# Patient Record
Sex: Male | Born: 1965 | Race: White | Hispanic: No | Marital: Married | Smoking: Never smoker
Health system: Southern US, Community
[De-identification: ages and names within clinical notes are randomized; demographics above are authoritative.]

## PROBLEM LIST (undated history)

## (undated) DIAGNOSIS — G44009 Cluster headache syndrome, unspecified, not intractable: Secondary | ICD-10-CM

## (undated) DIAGNOSIS — T7840XA Allergy, unspecified, initial encounter: Secondary | ICD-10-CM

## (undated) DIAGNOSIS — J309 Allergic rhinitis, unspecified: Secondary | ICD-10-CM

## (undated) DIAGNOSIS — M545 Low back pain: Secondary | ICD-10-CM

## (undated) DIAGNOSIS — G2581 Restless legs syndrome: Secondary | ICD-10-CM

## (undated) HISTORY — DX: Cluster headache syndrome, unspecified, not intractable: G44.009

## (undated) HISTORY — DX: Low back pain: M54.5

## (undated) HISTORY — PX: KNEE SURGERY: SHX244

## (undated) HISTORY — DX: Allergy, unspecified, initial encounter: T78.40XA

## (undated) HISTORY — DX: Allergic rhinitis, unspecified: J30.9

## (undated) HISTORY — PX: APPENDECTOMY: SHX54

## (undated) HISTORY — DX: Restless legs syndrome: G25.81

## (undated) HISTORY — PX: TONSILLECTOMY: SUR1361

---

## 2005-10-16 ENCOUNTER — Ambulatory Visit: Payer: Self-pay | Admitting: Family Medicine

## 2005-10-28 ENCOUNTER — Ambulatory Visit: Payer: Self-pay | Admitting: Family Medicine

## 2007-08-02 ENCOUNTER — Ambulatory Visit: Payer: Self-pay | Admitting: Family Medicine

## 2007-08-02 DIAGNOSIS — J309 Allergic rhinitis, unspecified: Secondary | ICD-10-CM

## 2007-08-02 DIAGNOSIS — M545 Low back pain, unspecified: Secondary | ICD-10-CM

## 2007-08-02 HISTORY — DX: Low back pain, unspecified: M54.50

## 2007-08-02 HISTORY — DX: Allergic rhinitis, unspecified: J30.9

## 2007-08-02 LAB — CONVERTED CEMR LAB
Albumin: 4 g/dL (ref 3.5–5.2)
Alkaline Phosphatase: 74 units/L (ref 39–117)
BUN: 19 mg/dL (ref 6–23)
Basophils Absolute: 0 10*3/uL (ref 0.0–0.1)
Blood in Urine, dipstick: NEGATIVE
Cholesterol: 153 mg/dL (ref 0–200)
Eosinophils Absolute: 0.4 10*3/uL (ref 0.0–0.6)
GFR calc Af Amer: 105 mL/min
GFR calc non Af Amer: 87 mL/min
HDL: 25.4 mg/dL — ABNORMAL LOW (ref >39.0)
Hemoglobin: 15.1 g/dL (ref 13.0–17.0)
LDL Cholesterol: 112 mg/dL — ABNORMAL HIGH (ref 0–99)
Lymphocytes Relative: 28.4 % (ref 12.0–46.0)
MCHC: 35.2 g/dL (ref 30.0–36.0)
Monocytes Absolute: 0.6 10*3/uL (ref 0.2–0.7)
Monocytes Relative: 11.3 % — ABNORMAL HIGH (ref 3.0–11.0)
Neutro Abs: 3 10*3/uL (ref 1.4–7.7)
Platelets: 277 10*3/uL (ref 150–400)
Potassium: 4.6 meq/L (ref 3.5–5.1)
Specific Gravity, Urine: >=1.03
Total Protein: 6.5 g/dL (ref 6.0–8.3)
Triglycerides: 78 mg/dL (ref 0–149)
Urobilinogen, UA: 0.2

## 2007-08-09 ENCOUNTER — Ambulatory Visit: Payer: Self-pay | Admitting: Family Medicine

## 2008-03-21 ENCOUNTER — Encounter: Admission: RE | Admit: 2008-03-21 | Discharge: 2008-03-21 | Payer: Self-pay | Admitting: Orthopedic Surgery

## 2008-03-28 ENCOUNTER — Ambulatory Visit: Payer: Self-pay | Admitting: Family Medicine

## 2008-03-28 DIAGNOSIS — G2581 Restless legs syndrome: Secondary | ICD-10-CM

## 2008-03-28 HISTORY — DX: Restless legs syndrome: G25.81

## 2008-10-26 ENCOUNTER — Ambulatory Visit: Payer: Self-pay | Admitting: Family Medicine

## 2009-02-08 ENCOUNTER — Ambulatory Visit: Payer: Self-pay | Admitting: Family Medicine

## 2009-02-08 DIAGNOSIS — G44009 Cluster headache syndrome, unspecified, not intractable: Secondary | ICD-10-CM | POA: Insufficient documentation

## 2009-02-08 HISTORY — DX: Cluster headache syndrome, unspecified, not intractable: G44.009

## 2011-06-16 ENCOUNTER — Other Ambulatory Visit (INDEPENDENT_AMBULATORY_CARE_PROVIDER_SITE_OTHER): Payer: BC Managed Care – PPO

## 2011-06-16 DIAGNOSIS — Z0000 Encounter for general adult medical examination without abnormal findings: Secondary | ICD-10-CM

## 2011-06-16 LAB — CBC WITH DIFFERENTIAL/PLATELET
Basophils Absolute: 0 10*3/uL (ref 0.0–0.1)
HCT: 44.7 % (ref 39.0–52.0)
Hemoglobin: 15.3 g/dL (ref 13.0–17.0)
Lymphs Abs: 1.3 10*3/uL (ref 0.7–4.0)
Monocytes Relative: 8.8 % (ref 3.0–12.0)
Neutro Abs: 2.7 10*3/uL (ref 1.4–7.7)
RDW: 12.7 % (ref 11.5–14.6)

## 2011-06-16 LAB — BASIC METABOLIC PANEL
CO2: 29 mEq/L (ref 19–32)
Chloride: 104 mEq/L (ref 96–112)
GFR: 83.45 mL/min (ref >60.00)
Glucose, Bld: 101 mg/dL — ABNORMAL HIGH (ref 70–99)
Potassium: 4.6 mEq/L (ref 3.5–5.1)
Sodium: 140 mEq/L (ref 135–145)

## 2011-06-16 LAB — POCT URINALYSIS DIPSTICK
Bilirubin, UA: NEGATIVE
Glucose, UA: NEGATIVE
Ketones, UA: NEGATIVE
Leukocytes, UA: NEGATIVE

## 2011-06-16 LAB — LIPID PANEL
LDL Cholesterol: 101 mg/dL — ABNORMAL HIGH (ref 0–99)
VLDL: 11.2 mg/dL (ref 0.0–40.0)

## 2011-06-16 LAB — HEPATIC FUNCTION PANEL
ALT: 20 U/L (ref 0–53)
Bilirubin, Direct: 0 mg/dL (ref 0.0–0.3)
Total Bilirubin: 0.6 mg/dL (ref 0.3–1.2)

## 2011-06-20 ENCOUNTER — Encounter: Payer: Self-pay | Admitting: Family Medicine

## 2011-06-23 ENCOUNTER — Ambulatory Visit (INDEPENDENT_AMBULATORY_CARE_PROVIDER_SITE_OTHER): Payer: BC Managed Care – PPO | Admitting: Family Medicine

## 2011-06-23 ENCOUNTER — Encounter: Payer: Self-pay | Admitting: Family Medicine

## 2011-06-23 DIAGNOSIS — Z0000 Encounter for general adult medical examination without abnormal findings: Secondary | ICD-10-CM

## 2011-06-23 DIAGNOSIS — G4700 Insomnia, unspecified: Secondary | ICD-10-CM | POA: Insufficient documentation

## 2011-06-23 NOTE — Patient Instructions (Signed)
Discontinue all caffeinated beverages.  Call me in two weeks if the sleep dysfunction persists

## 2011-06-23 NOTE — Progress Notes (Signed)
  Subjective:    Patient ID: Austin Bonilla, male    DOB: 1966-04-03, 45 y.o.   MRN: [id number]  HPI  Austin Bonilla is a 45 year old, married male, nonsmoker, who comes in today for general physical examination  Is always been in excellent health.  He said no chronic health problems.  Three months ago he began having sleep dysfunction.  Humibid can go to sleep once he goes to sleep.  He stays asleep.  No history of depression, anxiety.  Psychiatric review of systems negative,  He's also noticed decreased urinary stream.  No other urologic conditions.  Family history negative.  He does drink 3 to plus cups of caffeinated beverages daily.    Review of Systems  Constitutional: Negative.   HENT: Negative.   Eyes: Negative.   Respiratory: Negative.   Cardiovascular: Negative.   Gastrointestinal: Negative.   Genitourinary: Negative.   Musculoskeletal: Negative.   Skin: Negative.   Neurological: Negative.   Hematological: Negative.   Psychiatric/Behavioral: Negative.        Objective:   Physical Exam  Constitutional: He is oriented to person, place, and time. He appears well-developed and well-nourished.  HENT:  Head: Normocephalic and atraumatic.  Right Ear: External ear normal.  Left Ear: External ear normal.  Nose: Nose normal.  Mouth/Throat: Oropharynx is clear and moist.  Eyes: Conjunctivae and EOM are normal. Pupils are equal, round, and reactive to light.  Neck: Normal range of motion. Neck supple. No JVD present. No tracheal deviation present. No thyromegaly present.  Cardiovascular: Normal rate, regular rhythm, normal heart sounds and intact distal pulses.  Exam reveals no gallop and no friction rub.   No murmur heard. Pulmonary/Chest: Effort normal and breath sounds normal. No stridor. No respiratory distress. He has no wheezes. He has no rales. He exhibits no tenderness.  Abdominal: Soft. Bowel sounds are normal. He exhibits no distension and no mass. There is no  tenderness. There is no rebound and no guarding.  Genitourinary: Rectum normal, prostate normal and penis normal. Guaiac negative stool. No penile tenderness.  Musculoskeletal: Normal range of motion. He exhibits no edema and no tenderness.  Lymphadenopathy:    He has no cervical adenopathy.  Neurological: He is alert and oriented to person, place, and time. He has normal reflexes. No cranial nerve deficit. He exhibits normal muscle tone.  Skin: Skin is warm and dry. No rash noted. No erythema. No pallor.       Total body skin exam negative except for scar right upper quadrant, where he had a mole removed many years ago  Psychiatric: He has a normal mood and affect. His behavior is normal. Judgment and thought content normal.          Assessment & Plan:  Healthy male.  Insomnia and decreased urinary stream.  Plan DC caffeine return in two weeks.  The symptoms persist

## 2011-10-06 ENCOUNTER — Telehealth: Payer: Self-pay | Admitting: Family Medicine

## 2011-10-06 NOTE — Telephone Encounter (Signed)
Pt would like to be worked in today he is experiencing a sore throat, chills, sneezing and coughing

## 2013-12-05 ENCOUNTER — Other Ambulatory Visit (INDEPENDENT_AMBULATORY_CARE_PROVIDER_SITE_OTHER): Payer: BC Managed Care – PPO

## 2013-12-05 DIAGNOSIS — Z0000 Encounter for general adult medical examination without abnormal findings: Secondary | ICD-10-CM

## 2013-12-05 LAB — BASIC METABOLIC PANEL
BUN: 15 mg/dL (ref 6–23)
CALCIUM: 9 mg/dL (ref 8.4–10.5)
CO2: 26 mEq/L (ref 19–32)
CREATININE: 1 mg/dL (ref 0.4–1.5)
Chloride: 108 mEq/L (ref 96–112)
GFR: 85.46 mL/min (ref >60.00)
GLUCOSE: 94 mg/dL (ref 70–99)
Potassium: 4.2 mEq/L (ref 3.5–5.1)
Sodium: 142 mEq/L (ref 135–145)

## 2013-12-05 LAB — HEPATIC FUNCTION PANEL
ALK PHOS: 66 U/L (ref 39–117)
ALT: 19 U/L (ref 0–53)
AST: 18 U/L (ref 0–37)
Albumin: 4.1 g/dL (ref 3.5–5.2)
BILIRUBIN TOTAL: 0.6 mg/dL (ref 0.3–1.2)
Bilirubin, Direct: 0 mg/dL (ref 0.0–0.3)
Total Protein: 7 g/dL (ref 6.0–8.3)

## 2013-12-05 LAB — CBC WITH DIFFERENTIAL/PLATELET
BASOS ABS: 0 10*3/uL (ref 0.0–0.1)
Basophils Relative: 0.2 % (ref 0.0–3.0)
EOS PCT: 6.3 % — AB (ref 0.0–5.0)
Eosinophils Absolute: 0.3 10*3/uL (ref 0.0–0.7)
HEMATOCRIT: 46.8 % (ref 39.0–52.0)
Hemoglobin: 15.4 g/dL (ref 13.0–17.0)
LYMPHS ABS: 1.3 10*3/uL (ref 0.7–4.0)
Lymphocytes Relative: 25 % (ref 12.0–46.0)
MCHC: 33 g/dL (ref 30.0–36.0)
MCV: 94.2 fl (ref 78.0–100.0)
MONOS PCT: 8.7 % (ref 3.0–12.0)
Monocytes Absolute: 0.5 10*3/uL (ref 0.1–1.0)
NEUTROS PCT: 59.8 % (ref 43.0–77.0)
Neutro Abs: 3.2 10*3/uL (ref 1.4–7.7)
PLATELETS: 241 10*3/uL (ref 150.0–400.0)
RBC: 4.97 Mil/uL (ref 4.22–5.81)
RDW: 12.4 % (ref 11.5–14.6)
WBC: 5.3 10*3/uL (ref 4.5–10.5)

## 2013-12-05 LAB — POCT URINALYSIS DIPSTICK
Bilirubin, UA: NEGATIVE
GLUCOSE UA: NEGATIVE
KETONES UA: NEGATIVE
Leukocytes, UA: NEGATIVE
Nitrite, UA: NEGATIVE
PROTEIN UA: NEGATIVE
RBC UA: NEGATIVE
SPEC GRAV UA: 1.02
Urobilinogen, UA: 0.2
pH, UA: 7.5

## 2013-12-05 LAB — TSH: TSH: 1.92 u[IU]/mL (ref 0.35–5.50)

## 2013-12-05 LAB — LIPID PANEL
CHOLESTEROL: 174 mg/dL (ref 0–200)
HDL: 35.5 mg/dL — ABNORMAL LOW (ref >39.00)
LDL Cholesterol: 124 mg/dL — ABNORMAL HIGH (ref 0–99)
TRIGLYCERIDES: 72 mg/dL (ref 0.0–149.0)
Total CHOL/HDL Ratio: 5
VLDL: 14.4 mg/dL (ref 0.0–40.0)

## 2013-12-12 ENCOUNTER — Ambulatory Visit (INDEPENDENT_AMBULATORY_CARE_PROVIDER_SITE_OTHER): Payer: BC Managed Care – PPO | Admitting: Family Medicine

## 2013-12-12 ENCOUNTER — Encounter: Payer: Self-pay | Admitting: Family Medicine

## 2013-12-12 VITALS — BP 130/90 | Temp 98.1°F | Ht 73.75 in | Wt 240.0 lb

## 2013-12-12 DIAGNOSIS — M79609 Pain in unspecified limb: Secondary | ICD-10-CM

## 2013-12-12 DIAGNOSIS — M79674 Pain in right toe(s): Secondary | ICD-10-CM

## 2013-12-12 DIAGNOSIS — Z0000 Encounter for general adult medical examination without abnormal findings: Secondary | ICD-10-CM

## 2013-12-12 LAB — URIC ACID: Uric Acid, Serum: 5.5 mg/dL (ref 4.0–7.8)

## 2013-12-12 NOTE — Patient Instructions (Signed)
Continue good health habits  Remember to walk 30 minutes daily  We will check a uric acid level to see if the swelling in your toe is related to gout  If it's not that I would recommend [facility]

## 2013-12-12 NOTE — Progress Notes (Signed)
   Subjective:    Patient ID: Austin Bonilla, male    DOB: 10/25/1966, 48 y.o.   MRN: [id number]  HPI Austin Bonilla is a 48 year old married male nonsmoker who comes in today for general physical examination  He's always been in excellent health he said no chronic health problems. 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   HENT: Negative.   Eyes: Negative.   Respiratory: Negative.   Cardiovascular: Negative.   Gastrointestinal: Negative.   Endocrine: Negative.   Genitourinary: Negative.   Musculoskeletal: Negative.   Skin: Negative.   Allergic/Immunologic: Negative.   Neurological: Negative.   Hematological: Negative.   Psychiatric/Behavioral: Negative.        Objective:   Physical Exam  Nursing note and vitals reviewed. Constitutional: He is oriented to person, place, and time. He appears well-developed and well-nourished.  HENT:  Head: Normocephalic and atraumatic.  Right Ear: External ear normal.  Left Ear: External ear normal.  Nose: Nose normal.  Mouth/Throat: Oropharynx is clear and moist.  Eyes: Conjunctivae and EOM are normal. Pupils are equal, round, and reactive to light.  Neck: Normal range of motion. Neck supple. No JVD present. No tracheal deviation present. No thyromegaly present.  Cardiovascular: Normal rate, regular rhythm, normal heart sounds and intact distal pulses.  Exam reveals no gallop and no friction rub.   No murmur heard. Pulmonary/Chest: Effort normal and breath sounds normal. No stridor. No respiratory distress. He has no wheezes. He has no rales. He exhibits no tenderness.  Abdominal: Soft. Bowel sounds are normal. He exhibits no distension and no mass. There is no tenderness. There is no rebound and no guarding.  Genitourinary: Rectum  normal, prostate normal and penis normal. Guaiac negative stool. No penile tenderness.  Musculoskeletal: Normal range of motion. He exhibits no edema and no tenderness.  Lymphadenopathy:    He has no cervical adenopathy.  Neurological: He is alert and oriented to person, place, and time. He has normal reflexes. No cranial nerve deficit. He exhibits normal muscle tone.  Skin: Skin is warm and dry. No rash noted. No erythema. No pallor.  Psychiatric: He has a normal mood and affect. His behavior is normal. Judgment and thought content normal.   He has deformity and swelling of his proximal joint right great toe. He says his been that way for years but is getting worse. He's never had acute pain redness or swelling       Assessment & Plan:  Healthy male Swelling right great toe//////// check uric acid level Swelling right great toe rule out gout check uric acid level

## 2013-12-12 NOTE — Progress Notes (Signed)
Pre visit review using our clinic review tool, if applicable. No additional management support is needed unless otherwise documented below in the visit note. 

## 2013-12-14 ENCOUNTER — Telehealth: Payer: Self-pay | Admitting: Family Medicine

## 2013-12-14 NOTE — Telephone Encounter (Signed)
Pt states he has been trying to contact you. pls call on new mobile number listed

## 2013-12-15 NOTE — Telephone Encounter (Signed)
Spoke with patient.

## 2014-10-06 ENCOUNTER — Encounter: Payer: Self-pay | Admitting: Family Medicine

## 2014-10-06 ENCOUNTER — Ambulatory Visit (INDEPENDENT_AMBULATORY_CARE_PROVIDER_SITE_OTHER): Payer: BC Managed Care – PPO | Admitting: Family Medicine

## 2014-10-06 VITALS — BP 140/90 | Temp 97.9°F | Wt 239.0 lb

## 2014-10-06 DIAGNOSIS — M545 Low back pain, unspecified: Secondary | ICD-10-CM

## 2014-10-06 MED ORDER — METHYLPREDNISOLONE 4 MG PO KIT: PACK | ORAL | Status: DC

## 2014-10-06 MED ORDER — CYCLOBENZAPRINE HCL 10 MG PO TABS: 10.0000 mg | ORAL_TABLET | Freq: Three times a day (TID) | ORAL | Status: DC | PRN

## 2014-10-06 MED ORDER — HYDROCODONE-ACETAMINOPHEN 10-325 MG PO TABS: 1.0000 | ORAL_TABLET | Freq: Four times a day (QID) | ORAL | Status: DC | PRN

## 2014-10-06 NOTE — Progress Notes (Signed)
   Subjective:    Patient ID: Austin Bonilla, male    DOB: 1966/06/14, 48 y.o.   MRN: [id number]  HPI Here for 2 weeks of sharp lower back pains. No recent trauma. He has tried 800 mg of Motrin with partial relief. No radiation to the legs.    Review of Systems  Constitutional: Negative.   Musculoskeletal: Positive for back pain.       Objective:   Physical Exam  Constitutional: He appears well-developed and well-nourished.  In pain  Musculoskeletal:  Tender in the lower back with some spasm. Full ROM           Assessment & Plan:  Treat with meds for pain, inflammation, and spasm. Recheck prn

## 2014-10-06 NOTE — Progress Notes (Signed)
Pre visit review using our clinic review tool, if applicable. No additional management support is needed unless otherwise documented below in the visit note. 

## 2014-12-27 ENCOUNTER — Telehealth: Payer: Self-pay | Admitting: Family Medicine

## 2014-12-27 NOTE — Telephone Encounter (Signed)
Patient's BP has been bp 140/100,137/100,122/89.  He is scheduled for Monday at 3:30.

## 2014-12-28 NOTE — Telephone Encounter (Signed)
noted 

## 2015-01-01 ENCOUNTER — Ambulatory Visit (INDEPENDENT_AMBULATORY_CARE_PROVIDER_SITE_OTHER): Payer: BLUE CROSS/BLUE SHIELD | Admitting: Family Medicine

## 2015-01-01 ENCOUNTER — Encounter: Payer: Self-pay | Admitting: Family Medicine

## 2015-01-01 VITALS — BP 120/90 | Temp 98.3°F | Wt 235.0 lb

## 2015-01-01 DIAGNOSIS — R030 Elevated blood-pressure reading, without diagnosis of hypertension: Secondary | ICD-10-CM

## 2015-01-01 NOTE — Patient Instructions (Addendum)
No salt diet  Walk 30 minutes daily  Check your blood pressure daily in the morning  Return mid-April for follow-up............Marland Kitchen. bring a record of all your blood pressure readings and the device

## 2015-01-01 NOTE — Progress Notes (Signed)
Pre visit review using our clinic review tool, if applicable. No additional management support is needed unless otherwise documented below in the visit note. 

## 2015-01-01 NOTE — Progress Notes (Signed)
   Subjective:    Patient ID: Mary SellaWilliam M Vilar, male    DOB: August 11, 1966, 49 y.o.   MRN: [id number]  HPI Chrissie NoaWilliam is a 49 year old married male nonsmoker who comes in today because his blood pressure was elevated at the allergy office  His blood pressure was 140/100 there. He's been monitoring his blood pressure at home. His systolics are normal in the 120 to 1:30 range diastolics are high in the 90s.  Father had hypertension   Review of Systems    review of systems otherwise negative Objective:   Physical Exam  Well-developed well-nourished male no acute distress vital signs stable he is afebrile BP right arm sitting position 130/90      Assessment & Plan:  Elevated blood pressure,,,,,,,,,, no salt diet,,,,, BP check daily,,, follow-up mid-April

## 2015-02-12 ENCOUNTER — Ambulatory Visit (INDEPENDENT_AMBULATORY_CARE_PROVIDER_SITE_OTHER): Payer: BLUE CROSS/BLUE SHIELD | Admitting: Family Medicine

## 2015-02-12 ENCOUNTER — Encounter: Payer: Self-pay | Admitting: Family Medicine

## 2015-02-12 VITALS — BP 120/80 | Temp 98.7°F | Wt 235.0 lb

## 2015-02-12 DIAGNOSIS — I10 Essential (primary) hypertension: Secondary | ICD-10-CM

## 2015-02-12 MED ORDER — HYDROCHLOROTHIAZIDE 12.5 MG PO TABS: 12.5000 mg | ORAL_TABLET | Freq: Every day | ORAL | Status: DC

## 2015-02-12 NOTE — Patient Instructions (Signed)
Hydrochlorothiazide 12.5 mg.....Marland Kitchen. one tablet daily in the morning  Omron pump up digital blood pressure cuff.......... check your blood pressure right arm sitting position daily in the morning.......Marland Kitchen.  Return in 4 weeks for follow-up.......Marland Kitchen. bring a record of all your blood pressure readings and the device

## 2015-02-12 NOTE — Progress Notes (Signed)
   Subjective:    Patient ID: Mary SellaWilliam M Miceli, male    DOB: Oct 15, 1966, 49 y.o.   MRN: [id number]  HPI Chrissie NoaWilliam is a 49 year old married male nonsmoker who comes in today for reevaluation of elevated blood pressure  He's been monitoring his blood pressure at home. His blood pressure diastolic is elevated in the 90s systolics are all normal.  BP here today by me 140/90 right arm sitting position  He's been on a salt free diet and exercising however blood pressure still not at goal.  Family history positive for hypertension   Review of Systems Review of systems otherwise negative    Objective:   Physical Exam Well-developed well-nourished male no acute distress vital signs stable he is afebrile BP right arm sitting position 140/90 pulse 60 and regular       Assessment & Plan:  Hypertension/ Set,,,,,,,,,,,, begin low-dose diuretic follow-up in 4 weeks.

## 2015-02-12 NOTE — Progress Notes (Signed)
Pre visit review using our clinic review tool, if applicable. No additional management support is needed unless otherwise documented below in the visit note. 

## 2015-03-13 ENCOUNTER — Ambulatory Visit (INDEPENDENT_AMBULATORY_CARE_PROVIDER_SITE_OTHER): Payer: BLUE CROSS/BLUE SHIELD | Admitting: Family Medicine

## 2015-03-13 DIAGNOSIS — I10 Essential (primary) hypertension: Secondary | ICD-10-CM

## 2015-03-13 NOTE — Progress Notes (Signed)
Pre visit review using our clinic review tool, if applicable. No additional management support is needed unless otherwise documented below in the visit note. 

## 2015-03-13 NOTE — Progress Notes (Signed)
   Subjective:    Patient ID: Austin SellaWilliam M Bonilla, male    DOB: February 05, 1966, 49 y.o.   MRN: [id number]  HPI We have him is a 49 year old male who comes in today for follow-up of hypertension  We started him on low-dose diuretic 12.5 mg daily for mild hypertension. BP at home 130/80   Review of Systems No side effects of medication    Objective:   Physical Exam  Well-developed well-nourished male no acute distress vital signs stable he is afebrile BP 4:00 in the afternoon 140/90. He brings in morning blood pressures 130/80      Assessment & Plan:  Hypertension at goal........ continue current therapy follow-up at next physical exam monitor blood pressure at home

## 2015-03-13 NOTE — Patient Instructions (Signed)
Continue medication daily  Check your blood pressure weekly at home  Return when necessary

## 2015-09-03 ENCOUNTER — Other Ambulatory Visit: Payer: BLUE CROSS/BLUE SHIELD

## 2015-09-10 ENCOUNTER — Encounter: Payer: BLUE CROSS/BLUE SHIELD | Admitting: Family Medicine

## 2015-11-20 ENCOUNTER — Telehealth: Payer: Self-pay | Admitting: Family Medicine

## 2015-11-20 NOTE — Telephone Encounter (Signed)
Patient has a physical appt scheduled at the end of the month, but he is having problems with ED.  He wants to know if there is a lab test to check something for ED, can he have that done during the physical labs.

## 2015-11-21 NOTE — Telephone Encounter (Signed)
Spoke with patient and explained there is not a test for ED, but Dr Tawanna Cooler will discuss it with him at his appointment.

## 2015-11-26 ENCOUNTER — Other Ambulatory Visit (INDEPENDENT_AMBULATORY_CARE_PROVIDER_SITE_OTHER): Payer: Managed Care, Other (non HMO)

## 2015-11-26 DIAGNOSIS — Z0000 Encounter for general adult medical examination without abnormal findings: Secondary | ICD-10-CM | POA: Diagnosis not present

## 2015-11-26 LAB — HEPATIC FUNCTION PANEL
ALK PHOS: 68 U/L (ref 39–117)
ALT: 20 U/L (ref 0–53)
AST: 15 U/L (ref 0–37)
Albumin: 4.2 g/dL (ref 3.5–5.2)
BILIRUBIN DIRECT: 0.1 mg/dL (ref 0.0–0.3)
TOTAL PROTEIN: 6.5 g/dL (ref 6.0–8.3)
Total Bilirubin: 0.5 mg/dL (ref 0.2–1.2)

## 2015-11-26 LAB — LIPID PANEL
CHOL/HDL RATIO: 5
Cholesterol: 160 mg/dL (ref 0–200)
HDL: 33.7 mg/dL — AB (ref >39.00)
LDL CALC: 110 mg/dL — AB (ref 0–99)
NONHDL: 126.17
Triglycerides: 83 mg/dL (ref 0.0–149.0)
VLDL: 16.6 mg/dL (ref 0.0–40.0)

## 2015-11-26 LAB — CBC WITH DIFFERENTIAL/PLATELET
BASOS ABS: 0 10*3/uL (ref 0.0–0.1)
Basophils Relative: 0.4 % (ref 0.0–3.0)
EOS ABS: 0.4 10*3/uL (ref 0.0–0.7)
Eosinophils Relative: 7.7 % — ABNORMAL HIGH (ref 0.0–5.0)
HEMATOCRIT: 47.7 % (ref 39.0–52.0)
HEMOGLOBIN: 15.9 g/dL (ref 13.0–17.0)
LYMPHS PCT: 23.6 % (ref 12.0–46.0)
Lymphs Abs: 1.3 10*3/uL (ref 0.7–4.0)
MCHC: 33.4 g/dL (ref 30.0–36.0)
MCV: 91.7 fl (ref 78.0–100.0)
Monocytes Absolute: 0.6 10*3/uL (ref 0.1–1.0)
Monocytes Relative: 10.3 % (ref 3.0–12.0)
Neutro Abs: 3.3 10*3/uL (ref 1.4–7.7)
Neutrophils Relative %: 58 % (ref 43.0–77.0)
PLATELETS: 260 10*3/uL (ref 150.0–400.0)
RBC: 5.2 Mil/uL (ref 4.22–5.81)
RDW: 12.6 % (ref 11.5–15.5)
WBC: 5.6 10*3/uL (ref 4.0–10.5)

## 2015-11-26 LAB — BASIC METABOLIC PANEL
BUN: 15 mg/dL (ref 6–23)
CALCIUM: 9 mg/dL (ref 8.4–10.5)
CO2: 26 mEq/L (ref 19–32)
CREATININE: 1.05 mg/dL (ref 0.40–1.50)
Chloride: 106 mEq/L (ref 96–112)
GFR: 79.21 mL/min (ref >60.00)
Glucose, Bld: 99 mg/dL (ref 70–99)
Potassium: 3.9 mEq/L (ref 3.5–5.1)
Sodium: 142 mEq/L (ref 135–145)

## 2015-11-26 LAB — POCT URINALYSIS DIPSTICK
BILIRUBIN UA: NEGATIVE
Blood, UA: NEGATIVE
Glucose, UA: NEGATIVE
KETONES UA: NEGATIVE
Leukocytes, UA: NEGATIVE
Nitrite, UA: NEGATIVE
PROTEIN UA: NEGATIVE
SPEC GRAV UA: 1.02
Urobilinogen, UA: 0.2
pH, UA: 7

## 2015-11-26 LAB — PSA: PSA: 0.41 ng/mL (ref 0.10–4.00)

## 2015-11-26 LAB — TSH: TSH: 2.01 u[IU]/mL (ref 0.35–4.50)

## 2015-12-03 ENCOUNTER — Encounter: Payer: Self-pay | Admitting: Family Medicine

## 2015-12-03 ENCOUNTER — Ambulatory Visit (INDEPENDENT_AMBULATORY_CARE_PROVIDER_SITE_OTHER): Payer: Managed Care, Other (non HMO) | Admitting: Family Medicine

## 2015-12-03 VITALS — BP 120/80 | Temp 98.1°F | Ht 72.0 in | Wt 240.0 lb

## 2015-12-03 DIAGNOSIS — I10 Essential (primary) hypertension: Secondary | ICD-10-CM | POA: Diagnosis not present

## 2015-12-03 DIAGNOSIS — Z0000 Encounter for general adult medical examination without abnormal findings: Secondary | ICD-10-CM | POA: Diagnosis not present

## 2015-12-03 DIAGNOSIS — I714 Abdominal aortic aneurysm, without rupture, unspecified: Secondary | ICD-10-CM

## 2015-12-03 DIAGNOSIS — N529 Male erectile dysfunction, unspecified: Secondary | ICD-10-CM

## 2015-12-03 MED ORDER — HYDROCHLOROTHIAZIDE 12.5 MG PO TABS: 12.5000 mg | ORAL_TABLET | Freq: Every day | ORAL | Status: DC

## 2015-12-03 MED ORDER — SILDENAFIL CITRATE 50 MG PO TABS: 50.0000 mg | ORAL_TABLET | ORAL | Status: DC | PRN

## 2015-12-03 NOTE — Progress Notes (Signed)
Pre visit review using our clinic review tool, if applicable. No additional management support is needed unless otherwise documented below in the visit note. 

## 2015-12-03 NOTE — Progress Notes (Signed)
   Subjective:    Patient ID: Austin Bonilla, male    DOB: 02-Feb-1966, 50 y.o.   MRN: [id number]  HPI Chalmers is a 50 year old married male nonsmoker who works as a Pensions consultant for [employer] who comes in today for general physical examination  He takes Hydrocort thiazide 12.5 mg daily because of a history of mild hypertension. BP 120/80  He gets routine eye care, dental care, never had a colonoscopy.  Family history pertinent his mother was recently diagnosed with a AAA in his paternal grandfather had AAA  He's also knows last 12 months and ADD and would like to have an evaluation.  Vaccinations up-to-date   Review of Systems  Constitutional: Negative.   HENT: Negative.   Eyes: Negative.   Respiratory: Negative.   Cardiovascular: Negative.   Gastrointestinal: Negative.   Endocrine: Negative.   Genitourinary: Negative.   Musculoskeletal: Negative.   Skin: Negative.   Allergic/Immunologic: Negative.   Neurological: Negative.   Hematological: Negative.   Psychiatric/Behavioral: Negative.        Objective:   Physical Exam  Constitutional: He is oriented to person, place, and time. He appears well-developed and well-nourished.  HENT:  Head: Normocephalic and atraumatic.  Right Ear: External ear normal.  Left Ear: External ear normal.  Nose: Nose normal.  Mouth/Throat: Oropharynx is clear and moist.  Eyes: Conjunctivae and EOM are normal. Pupils are equal, round, and reactive to light.  Neck: Normal range of motion. Neck supple. No JVD present. No tracheal deviation present. No thyromegaly present.  Cardiovascular: Normal rate, regular rhythm, normal heart sounds and intact distal pulses.  Exam reveals no gallop and no friction rub.   No murmur heard. No carotid nor aortic bruits peripheral pulses 2+ and symmetrical  Pulmonary/Chest: Effort normal and breath sounds normal. No stridor. No respiratory distress. He has no wheezes. He has no rales. He exhibits no  tenderness.  Abdominal: Soft. Bowel sounds are normal. He exhibits no distension and no mass. There is no tenderness. There is no rebound and no guarding.  Genitourinary: Rectum normal, prostate normal and penis normal. Guaiac negative stool. No penile tenderness.  Musculoskeletal: Normal range of motion. He exhibits no edema or tenderness.  Lymphadenopathy:    He has no cervical adenopathy.  Neurological: He is alert and oriented to person, place, and time. He has normal reflexes. No cranial nerve deficit. He exhibits normal muscle tone.  Skin: Skin is warm and dry. No rash noted. No erythema. No pallor.  Total body skin exam normal  Psychiatric: He has a normal mood and affect. His behavior is normal. Judgment and thought content normal.  Nursing note and vitals reviewed.         Assessment & Plan:  Mild hypertension................... continue Hydrocort thiazide 12.5 mg daily  New onset ED.......... check T level......Marland Kitchen begin Viagra  Family history of aaa......... ultrasound aorta.

## 2015-12-03 NOTE — Patient Instructions (Signed)
We will set up a time in GI for you to meet them to discuss screening colonoscopy  We will also set up a time in x-ray to get your aorta screened  We will also get a testosterone level today...........Marland Kitchen Viagra 50 mg......... one half tab 2-3 hours prior to sex............ [email or web address]  Follow-up in one year for general physical exam sooner if any problems

## 2015-12-04 LAB — TESTOSTERONE TOTAL,FREE,BIO, MALES
Albumin: 4.2 g/dL (ref 3.6–5.1)
Sex Hormone Binding: 31 nmol/L (ref 10–50)
TESTOSTERONE BIOAVAILABLE: 56.9 ng/dL — AB (ref 130.5–681.7)
TESTOSTERONE FREE: 29.5 pg/mL — AB (ref 47.0–244.0)
Testosterone: 223 ng/dL — ABNORMAL LOW (ref 250–827)

## 2015-12-05 ENCOUNTER — Other Ambulatory Visit: Payer: Self-pay | Admitting: Family Medicine

## 2015-12-05 DIAGNOSIS — R7989 Other specified abnormal findings of blood chemistry: Secondary | ICD-10-CM

## 2016-03-11 ENCOUNTER — Telehealth: Payer: Self-pay | Admitting: *Deleted

## 2016-03-11 DIAGNOSIS — K921 Melena: Secondary | ICD-10-CM

## 2016-03-11 DIAGNOSIS — R197 Diarrhea, unspecified: Secondary | ICD-10-CM

## 2016-03-11 NOTE — Telephone Encounter (Signed)
Patient calling because he has not be feeling well for 3 days.  He has had diarrhea.  He went to urgent care and had a positive stool test for blood. Patient is having a lot of pressure and feels as though he has to push.  His pain increases after eating.  Please advise.

## 2016-03-11 NOTE — Telephone Encounter (Signed)
Per Dr Tawanna Coolerodd - referral to GI placed.  If no improvements patient will need an office visit.  Patient is aware.

## 2016-03-12 ENCOUNTER — Ambulatory Visit: Payer: Managed Care, Other (non HMO) | Admitting: Adult Health

## 2016-03-12 ENCOUNTER — Encounter: Payer: Self-pay | Admitting: Internal Medicine

## 2016-05-12 ENCOUNTER — Encounter: Payer: Self-pay | Admitting: Internal Medicine

## 2016-05-12 ENCOUNTER — Ambulatory Visit (INDEPENDENT_AMBULATORY_CARE_PROVIDER_SITE_OTHER): Payer: Managed Care, Other (non HMO) | Admitting: Internal Medicine

## 2016-05-12 VITALS — BP 110/86 | HR 81 | Ht 72.0 in | Wt 237.0 lb

## 2016-05-12 DIAGNOSIS — K529 Noninfective gastroenteritis and colitis, unspecified: Secondary | ICD-10-CM

## 2016-05-12 DIAGNOSIS — Z1211 Encounter for screening for malignant neoplasm of colon: Secondary | ICD-10-CM | POA: Diagnosis not present

## 2016-05-12 MED ORDER — NA SULFATE-K SULFATE-MG SULF 17.5-3.13-1.6 GM/177ML PO SOLN: 1.0000 | Freq: Once | ORAL | Status: DC

## 2016-05-12 NOTE — Progress Notes (Signed)
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 Tawanna Coolerodd, regarding screening colonoscopy after having had problems with diarrhea and rectal bleeding. Patient reports being in his usual state of good health until about 6 weeks ago he developed problems with severe diarrhea and abdominal cramping. He was seen at urgent care. Problem lasted severely for 4 days. They found blood. He saw blood. Symptoms resolved about one week later. Since that time no complaints. Back to baseline with normal regular bowel movements. No family history of colon cancer. No other complaints.  REVIEW OF SYSTEMS:  All non-GI ROS negative upon comprehensive review  Past Medical History  Diagnosis Date  . RESTLESS LEG SYNDROME, SEVERE 03/28/2008  . 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 ALLERGIC RHINITIS 08/02/2007  . LOW BACK PAIN 08/02/2007    Past Surgical History  Procedure Laterality Date  . Appendectomy    . Tonsillectomy      Social History Austin Bonilla  reports that he has never smoked. He has never used smokeless tobacco. He reports that he does not drink alcohol. His drug history is not on file.  family history includes Aneurysm in his other; Hypertension in his other; Kidney disease in his other.  Allergies  Allergen Reactions  . Penicillins   . Sulfamethoxazole-Trimethoprim        PHYSICAL EXAMINATION: Vital signs: BP 110/86 mmHg  Pulse 81  Ht 6' (1.829 m)  Wt 237 lb (107.502 kg)  BMI 32.14 kg/m2  Constitutional: generally well-appearing, no acute distress Psychiatric: alert and oriented x3, cooperative Eyes: extraocular movements intact, anicteric, conjunctiva pink Mouth: oral pharynx moist, no lesions Neck: supple no lymphadenopathy Cardiovascular: heart regular rate and rhythm, no murmur Lungs: clear to auscultation bilaterally Abdomen: soft, nontender, nondistended, no  obvious ascites, no peritoneal signs, normal bowel sounds, no organomegaly Rectal:Deferred until colonoscopy Extremities: no clubbing cyanosis or lower extremity edema bilaterally Skin: no lesions on visible extremities Neuro: No focal deficits. No asterixis.    ASSESSMENT:  #1. Acute infectious gastroenteritis. Sounds like transient bacterial colitis. Resolved #2. colon cancer screening. Appropriate candidate without contraindication  PLAN:  1. Colonoscopy.The nature of the procedure, as well as the risks, benefits, and alternatives were carefully and thoroughly reviewed with the patient. Ample time for discussion and questions allowed. The patient understood, was satisfied, and agreed to proceed.  A copy of this consultation note has been sent to Dr. Tawanna Coolerodd

## 2016-05-12 NOTE — Patient Instructions (Signed)
You have been scheduled for a colonoscopy. Please follow written instructions given to you at your visit today.  Please pick up your prep supplies at the pharmacy within the next 1-3 days. If you use inhalers (even only as needed), please bring them with you on the day of your procedure.   

## 2016-07-21 ENCOUNTER — Ambulatory Visit (AMBULATORY_SURGERY_CENTER): Payer: Managed Care, Other (non HMO) | Admitting: Internal Medicine

## 2016-07-21 ENCOUNTER — Encounter: Payer: Self-pay | Admitting: Internal Medicine

## 2016-07-21 VITALS — BP 128/86 | HR 65 | Temp 99.3°F | Resp 13 | Ht 72.0 in | Wt 237.0 lb

## 2016-07-21 DIAGNOSIS — Z1211 Encounter for screening for malignant neoplasm of colon: Secondary | ICD-10-CM | POA: Diagnosis not present

## 2016-07-21 MED ORDER — SODIUM CHLORIDE 0.9 % IV SOLN: 500.0000 mL | INTRAVENOUS | Status: DC

## 2016-07-21 NOTE — Patient Instructions (Signed)
YOU HAD AN ENDOSCOPIC PROCEDURE TODAY AT THE [facility]:   Refer to the procedure report that was given to you for any specific questions about what was found during the examination.  If the procedure report does not answer your questions, please call your gastroenterologist to clarify.  If you requested that your care partner not be given the details of your procedure findings, then the procedure report has been included in a sealed envelope for you to review at your convenience later.  YOU SHOULD EXPECT: Some feelings of bloating in the abdomen. Passage of more gas than usual.  Walking can help get rid of the air that was put into your GI tract during the procedure and reduce the bloating. If you had a lower endoscopy (such as a colonoscopy or flexible sigmoidoscopy) you may notice spotting of blood in your stool or on the toilet paper. If you underwent a bowel prep for your procedure, you may not have a normal bowel movement for a few days.  Please Note:  You might notice some irritation and congestion in your nose or some drainage.  This is from the oxygen used during your procedure.  There is no need for concern and it should clear up in a day or so.  SYMPTOMS TO REPORT IMMEDIATELY:   Following lower endoscopy (colonoscopy or flexible sigmoidoscopy):  Excessive amounts of blood in the stool  Significant tenderness or worsening of abdominal pains  Swelling of the abdomen that is new, acute  Fever of 100F or higher   For urgent or emergent issues, a gastroenterologist can be reached at any hour by calling [phone].   DIET:  We do recommend a small meal at first, but then you may proceed to your regular diet.  Drink plenty of fluids but you should avoid alcoholic beverages for 24 hours.  ACTIVITY:  You should plan to take it easy for the rest of today and you should NOT DRIVE or use heavy machinery until tomorrow (because of the sedation medicines used during the test).     FOLLOW UP: Our staff will call the number listed on your records the next business day following your procedure to check on you and address any questions or concerns that you may have regarding the information given to you following your procedure. If we do not reach you, we will leave a message.  However, if you are feeling well and you are not experiencing any problems, there is no need to return our call.  We will assume that you have returned to your regular daily activities without incident.  If any biopsies were taken you will be contacted by phone or by letter within the next 1-3 weeks.  Please call us at [phone] if you have not heard about the biopsies in 3 weeks.    SIGNATURES/CONFIDENTIALITY: You and/or your care partner have signed paperwork which will be entered into your electronic medical record.  These signatures attest to the fact that that the information above on your After Visit Summary has been reviewed and is understood.  Full responsibility of the confidentiality of this discharge information lies with you and/or your care-partner.  Hemorrhoids-handouts given  Repeat colonoscopy in 10 years 2027.

## 2016-07-21 NOTE — Progress Notes (Signed)
Report to PACU, RN, vss, BBS= Clear.  

## 2016-07-21 NOTE — Progress Notes (Signed)
When disconnecting pt from monitor, nurse noticed a red rash on pts chest, pt denies having rash before procedure, rash was red and mostly on L side, pt denies itching or irritation, had [place] CRNA assess pt, pt states he gets a rash at hotels from there cleaning supplies, may be from detergent used on gown and sheets, pt will get dressed but leave top off to see if irritation goes away, pt still has redness on chest and l side after reassessment, pt still denies and irritation, pt states he feels like this is just from the detergent and does not feel we need to keep him any longer, pt is discharge with understanding if rash gets worse to let us know-adm

## 2016-07-21 NOTE — Op Note (Addendum)
[facility] Patient Name: Austin AreolaWilliam Bonilla Procedure Date: 07/21/2016 11:07 AM MRN: [id number] Endoscopist: Wilhemina BonitoJohn N. Marina GoodellPerry , MD Age: 50 Referring MD:  Date of Birth: 04-17-1966 Gender: Male Account #: [id number] Procedure:                Colonoscopy Indications:              Screening for colorectal malignant neoplasm Medicines:                Monitored Anesthesia Care Procedure:                Pre-Anesthesia Assessment:                           - Prior to the procedure, a History and Physical                            was performed, and patient medications and                            allergies were reviewed. The patient's tolerance of                            previous anesthesia was also reviewed. The risks                            and benefits of the procedure and the sedation                            options and risks were discussed with the patient.                            All questions were answered, and informed consent                            was obtained. Prior Anticoagulants: The patient has                            taken no previous anticoagulant or antiplatelet                            agents. ASA Grade Assessment: I - A normal, healthy                            patient. After reviewing the risks and benefits,                            the patient was deemed in satisfactory condition to                            undergo the procedure.                           After obtaining informed consent, the colonoscope  was passed under direct vision. Throughout the                            procedure, the patient's blood pressure, pulse, and                            oxygen saturations were monitored continuously. The                            Model CF-HQ190L [id number]) scope was introduced                            through the anus and advanced to the the cecum,                            identified by appendiceal  orifice and ileocecal                            valve. The ileocecal valve, appendiceal orifice,                            and rectum were photographed. The quality of the                            bowel preparation was excellent. The colonoscopy                            was performed without difficulty. The patient                            tolerated the procedure well. The bowel preparation                            used was SUPREP. Scope In: 11:14:05 AM Scope Out: 11:26:41 AM Scope Withdrawal Time: 0 hours 9 minutes 47 seconds  Total Procedure Duration: 0 hours 12 minutes 36 seconds  Findings:                 Internal hemorrhoids were found during retroflexion.                           The entire examined colon appeared normal on direct                            and retroflexion views. Complications:            No immediate complications. Estimated blood loss:                            None. Estimated Blood Loss:     Estimated blood loss: none. Impression:               - Internal hemorrhoids.                           - The entire examined colon is normal on direct  and                            retroflexion views.                           - No specimens collected. Recommendation:           - Repeat colonoscopy in 10 years for screening                            purposes.                           - Patient has a contact number available for                            emergencies. The signs and symptoms of potential                            delayed complications were discussed with the                            patient. Return to normal activities tomorrow.                            Written discharge instructions were provided to the                            patient.                           - Resume previous diet.                           - Continue present medications. Wilhemina Bonito. Marina Goodell, MD 07/21/2016 11:30:14 AM This report has been signed electronically.

## 2016-07-22 ENCOUNTER — Telehealth: Payer: Self-pay

## 2016-07-22 NOTE — Telephone Encounter (Signed)
  Follow up Call-  Call back number 07/21/2016  Post procedure Call Back phone  # [phone]  Permission to leave phone message Yes  Some recent data might be hidden    Patient was called for follow up after his procedure on 07/21/2016. No answer at the number given for follow up phone call. A message was left on the answering machine.

## 2017-01-16 ENCOUNTER — Other Ambulatory Visit: Payer: Self-pay | Admitting: Family Medicine

## 2017-01-16 DIAGNOSIS — I10 Essential (primary) hypertension: Secondary | ICD-10-CM

## 2017-01-16 DIAGNOSIS — Z0000 Encounter for general adult medical examination without abnormal findings: Secondary | ICD-10-CM

## 2017-01-16 DIAGNOSIS — E349 Endocrine disorder, unspecified: Secondary | ICD-10-CM

## 2017-01-19 ENCOUNTER — Other Ambulatory Visit (INDEPENDENT_AMBULATORY_CARE_PROVIDER_SITE_OTHER): Payer: Managed Care, Other (non HMO)

## 2017-01-19 DIAGNOSIS — I10 Essential (primary) hypertension: Secondary | ICD-10-CM | POA: Diagnosis not present

## 2017-01-19 DIAGNOSIS — Z0000 Encounter for general adult medical examination without abnormal findings: Secondary | ICD-10-CM | POA: Diagnosis not present

## 2017-01-19 DIAGNOSIS — E349 Endocrine disorder, unspecified: Secondary | ICD-10-CM

## 2017-01-19 LAB — LIPID PANEL
Cholesterol: 147 mg/dL (ref 0–200)
HDL: 37 mg/dL — ABNORMAL LOW (ref >39.00)
LDL CALC: 95 mg/dL (ref 0–99)
NonHDL: 109.64
Total CHOL/HDL Ratio: 4
Triglycerides: 75 mg/dL (ref 0.0–149.0)
VLDL: 15 mg/dL (ref 0.0–40.0)

## 2017-01-19 LAB — CBC WITH DIFFERENTIAL/PLATELET
Basophils Absolute: 0 10*3/uL (ref 0.0–0.1)
Basophils Relative: 0.8 % (ref 0.0–3.0)
EOS ABS: 0.4 10*3/uL (ref 0.0–0.7)
Eosinophils Relative: 7.2 % — ABNORMAL HIGH (ref 0.0–5.0)
HEMATOCRIT: 46.3 % (ref 39.0–52.0)
Hemoglobin: 16 g/dL (ref 13.0–17.0)
LYMPHS PCT: 24.1 % (ref 12.0–46.0)
Lymphs Abs: 1.3 10*3/uL (ref 0.7–4.0)
MCHC: 34.5 g/dL (ref 30.0–36.0)
MCV: 90.2 fl (ref 78.0–100.0)
MONOS PCT: 10 % (ref 3.0–12.0)
Monocytes Absolute: 0.5 10*3/uL (ref 0.1–1.0)
NEUTROS ABS: 3.2 10*3/uL (ref 1.4–7.7)
Neutrophils Relative %: 57.9 % (ref 43.0–77.0)
PLATELETS: 275 10*3/uL (ref 150.0–400.0)
RBC: 5.14 Mil/uL (ref 4.22–5.81)
RDW: 12.7 % (ref 11.5–15.5)
WBC: 5.5 10*3/uL (ref 4.0–10.5)

## 2017-01-19 LAB — POC URINALSYSI DIPSTICK (AUTOMATED)
BILIRUBIN UA: NEGATIVE
GLUCOSE UA: NEGATIVE
KETONES UA: NEGATIVE
LEUKOCYTES UA: NEGATIVE
NITRITE UA: NEGATIVE
PH UA: 6.5 (ref 5.0–8.0)
Protein, UA: NEGATIVE
RBC UA: NEGATIVE
Spec Grav, UA: 1.02 (ref 1.030–1.035)
Urobilinogen, UA: 0.2 (ref ?–2.0)

## 2017-01-19 LAB — HEMOGLOBIN A1C: HEMOGLOBIN A1C: 5.6 % (ref 4.6–6.5)

## 2017-01-19 LAB — BASIC METABOLIC PANEL
BUN: 13 mg/dL (ref 6–23)
CO2: 29 meq/L (ref 19–32)
CREATININE: 1 mg/dL (ref 0.40–1.50)
Calcium: 9.5 mg/dL (ref 8.4–10.5)
Chloride: 105 mEq/L (ref 96–112)
GFR: 83.42 mL/min (ref >60.00)
Glucose, Bld: 95 mg/dL (ref 70–99)
Potassium: 4.3 mEq/L (ref 3.5–5.1)
Sodium: 142 mEq/L (ref 135–145)

## 2017-01-19 LAB — HEPATIC FUNCTION PANEL
ALBUMIN: 4.2 g/dL (ref 3.5–5.2)
ALK PHOS: 78 U/L (ref 39–117)
ALT: 18 U/L (ref 0–53)
AST: 14 U/L (ref 0–37)
BILIRUBIN DIRECT: 0.1 mg/dL (ref 0.0–0.3)
TOTAL PROTEIN: 6.6 g/dL (ref 6.0–8.3)
Total Bilirubin: 0.5 mg/dL (ref 0.2–1.2)

## 2017-01-19 LAB — PSA: PSA: 0.42 ng/mL (ref 0.10–4.00)

## 2017-01-19 LAB — TSH: TSH: 1.53 u[IU]/mL (ref 0.35–4.50)

## 2017-01-20 LAB — TESTOSTERONE TOTAL,FREE,BIO, MALES
ALBUMIN: 4.2 g/dL (ref 3.6–5.1)
SEX HORMONE BINDING: 29 nmol/L (ref 10–50)
Testosterone, Bioavailable: 116.7 ng/dL (ref 110.0–575.0)
Testosterone, Free: 60.6 pg/mL (ref 46.0–224.0)
Testosterone: 407 ng/dL (ref 250–827)

## 2017-01-27 ENCOUNTER — Ambulatory Visit (INDEPENDENT_AMBULATORY_CARE_PROVIDER_SITE_OTHER): Payer: Managed Care, Other (non HMO) | Admitting: Family Medicine

## 2017-01-27 ENCOUNTER — Encounter: Payer: Self-pay | Admitting: Family Medicine

## 2017-01-27 VITALS — BP 122/80 | Temp 98.6°F | Ht 72.75 in | Wt 234.0 lb

## 2017-01-27 DIAGNOSIS — N529 Male erectile dysfunction, unspecified: Secondary | ICD-10-CM | POA: Diagnosis not present

## 2017-01-27 DIAGNOSIS — Z0001 Encounter for general adult medical examination with abnormal findings: Secondary | ICD-10-CM

## 2017-01-27 DIAGNOSIS — I10 Essential (primary) hypertension: Secondary | ICD-10-CM | POA: Diagnosis not present

## 2017-01-27 DIAGNOSIS — G8929 Other chronic pain: Secondary | ICD-10-CM

## 2017-01-27 DIAGNOSIS — M545 Low back pain, unspecified: Secondary | ICD-10-CM

## 2017-01-27 DIAGNOSIS — Z8249 Family history of ischemic heart disease and other diseases of the circulatory system: Secondary | ICD-10-CM | POA: Diagnosis not present

## 2017-01-27 DIAGNOSIS — Z136 Encounter for screening for cardiovascular disorders: Secondary | ICD-10-CM | POA: Insufficient documentation

## 2017-01-27 DIAGNOSIS — Z0000 Encounter for general adult medical examination without abnormal findings: Secondary | ICD-10-CM

## 2017-01-27 MED ORDER — PREDNISONE 20 MG PO TABS: ORAL_TABLET | ORAL | 1 refills | Status: DC

## 2017-01-27 MED ORDER — CYCLOBENZAPRINE HCL 10 MG PO TABS: ORAL_TABLET | ORAL | 3 refills | Status: AC

## 2017-01-27 MED ORDER — SILDENAFIL CITRATE 50 MG PO TABS: 50.0000 mg | ORAL_TABLET | ORAL | 11 refills | Status: AC | PRN

## 2017-01-27 MED ORDER — TRAMADOL HCL 50 MG PO TABS: ORAL_TABLET | ORAL | 2 refills | Status: AC

## 2017-01-27 NOTE — Patient Instructions (Signed)
Prednisone 20 mg............. 2 tabs 3 days taper as outlined  Prior to bedtime take a Flexeril and tramadol  We'll set you up for physical therapy ASAP  Avoid sitting for more than 45 minutes  Continue other medicines  We'll set you up for a scan of your to because of your family history of the aneurysm..Marland Kitchen

## 2017-01-27 NOTE — Progress Notes (Signed)
Pre visit review using our clinic review tool, if applicable. No additional management support is needed unless otherwise documented below in the visit note. 

## 2017-01-27 NOTE — Progress Notes (Signed)
Chrissie NoaWilliam is a 51 year old married male nonsmoker who works the night shift and maintenance at time [employer]. He's been doing 9 PM to 8 AM for the last 6 months. He hopes to get off this in September. It's been very difficult working nights.  He's had trouble with his back on and off for many years. He does not exercise on a regular basis. About 3 weeks ago it started bothering him. No antecedent trauma. He described his pain is constant, an 8 on a scale of 1-10, radiates to his left lower groin area not down to his legs. He is able to sleep at night. He's got a lumbar back brace but it hasn't helped. It's sharp pain. In the past she's used Flexeril and rest and that's gotten a better but this time hasn't improved  He gets routine eye care, dental care, colonoscopy 2000 is normal  Vaccinations up-to-date except he needs a tetanus booster next 08/08/2017.  14 point review of systems reviewed and otherwise negative  BP 122/80 (BP Location: Left Arm, Patient Position: Sitting, Cuff Size: Large)   Temp 98.6 F (37 C) (Oral)   Ht 6' 0.75" (1.848 m)   Wt 234 lb (106.1 kg)   BMI 31.09 kg/m  Examination of the HEENT were negative neck was supple thyroid not enlarged no carotid bruits cardiopulmonary exam normal abdominal exam normal genitalia normal circumcised male rectal normal stool guaiac-negative prostate smooth nonnodular normal size prostate. Extremities normal skin normal peripheral pulses normal  #1 healthy male  #2 low back pain nerve root irritation.......... start back on the back exercise program with physical therapy........ short course of prednisone............ Flexeril and tramadol at bedtime  #3 erectile dysfunction,,,,,,, refill Viagra.  #4 family history of AAA....... screening ultrasound

## 2017-02-18 ENCOUNTER — Other Ambulatory Visit: Payer: Self-pay | Admitting: Family Medicine

## 2017-02-18 ENCOUNTER — Telehealth: Payer: Self-pay | Admitting: Family Medicine

## 2017-02-18 DIAGNOSIS — I714 Abdominal aortic aneurysm, without rupture, unspecified: Secondary | ICD-10-CM

## 2017-02-18 NOTE — Telephone Encounter (Signed)
-----   Message from Rosiland Oz sent at 02/18/2017  4:18 PM EDT ----- Regarding: RE: AAA screen If patient has a Vascuscreen, it is $50 out of pocket, and we do not bill insurance.  The patient can take the receipt and submit to insurance on their own, though.  Just use the codes below to order.  If you have any trouble, call me tomorrow [phone]), or [place] [phone]) and one of [place] can walk you through.  ----- Message ----- From: Nils Flack, CMA Sent: 02/18/2017   3:55 PM To: Melissa C Church Subject: RE: AAA screen                                 Melissa,  Dr. Tawanna Cooler would like to order the test.  Pt does not have AAA but does have a family history.  Do you know if the family history code will cover it?  Misty ----- Message ----- From: Roderick Pee, MD Sent: 02/09/2017  10:16 AM To: Nils Flack, CMA Subject: FW: AAA screen                                   ----- Message ----- From: Rosiland Oz Sent: 02/04/2017   3:30 PM To: Roderick Pee, MD Subject: AAA screen                                     Patient does not meet criteria for Medicare AAA screening (age).  I am afraid insurance may not cover screening.  We can do a Vascuscreen, which does screen for AAA, and it only costs $50.  Would you be interested in changing study to that? Order number for that is [id number], visit type [phone] Please advise. Thanks, Missy

## 2017-02-23 ENCOUNTER — Other Ambulatory Visit: Payer: Self-pay | Admitting: Family Medicine

## 2017-02-23 ENCOUNTER — Ambulatory Visit (HOSPITAL_COMMUNITY)
Admission: RE | Admit: 2017-02-23 | Discharge: 2017-02-23 | Disposition: A | Discharge status: Home / Self Care | Payer: Self-pay | Source: Ambulatory Visit | Attending: Cardiology | Admitting: Cardiology

## 2017-02-23 DIAGNOSIS — Z8249 Family history of ischemic heart disease and other diseases of the circulatory system: Secondary | ICD-10-CM

## 2017-02-23 NOTE — Telephone Encounter (Signed)
I put in an order for the Vasuscreen.  Let me know if you need anything else.

## 2017-07-24 ENCOUNTER — Encounter: Payer: Self-pay | Admitting: Family Medicine

## 2018-02-01 ENCOUNTER — Ambulatory Visit: Payer: Managed Care, Other (non HMO) | Admitting: Family Medicine

## 2018-02-16 ENCOUNTER — Ambulatory Visit (INDEPENDENT_AMBULATORY_CARE_PROVIDER_SITE_OTHER): Payer: Managed Care, Other (non HMO) | Admitting: Family Medicine

## 2018-02-16 ENCOUNTER — Encounter: Payer: Self-pay | Admitting: Family Medicine

## 2018-02-16 VITALS — BP 118/88 | HR 68 | Temp 98.1°F | Ht 72.0 in | Wt 227.0 lb

## 2018-02-16 DIAGNOSIS — J309 Allergic rhinitis, unspecified: Secondary | ICD-10-CM

## 2018-02-16 DIAGNOSIS — Z0001 Encounter for general adult medical examination with abnormal findings: Secondary | ICD-10-CM | POA: Diagnosis not present

## 2018-02-16 DIAGNOSIS — Z0000 Encounter for general adult medical examination without abnormal findings: Secondary | ICD-10-CM

## 2018-02-16 DIAGNOSIS — N529 Male erectile dysfunction, unspecified: Secondary | ICD-10-CM | POA: Diagnosis not present

## 2018-02-16 DIAGNOSIS — Z23 Encounter for immunization: Secondary | ICD-10-CM | POA: Diagnosis not present

## 2018-02-16 DIAGNOSIS — K30 Functional dyspepsia: Secondary | ICD-10-CM | POA: Diagnosis not present

## 2018-02-16 LAB — BASIC METABOLIC PANEL
BUN: 14 mg/dL (ref 6–23)
CHLORIDE: 107 meq/L (ref 96–112)
CO2: 28 meq/L (ref 19–32)
CREATININE: 0.88 mg/dL (ref 0.40–1.50)
Calcium: 9.2 mg/dL (ref 8.4–10.5)
GFR: 96.28 mL/min (ref >60.00)
Glucose, Bld: 91 mg/dL (ref 70–99)
POTASSIUM: 4.3 meq/L (ref 3.5–5.1)
SODIUM: 141 meq/L (ref 135–145)

## 2018-02-16 LAB — HEPATIC FUNCTION PANEL
ALT: 13 U/L (ref 0–53)
AST: 11 U/L (ref 0–37)
Albumin: 4.1 g/dL (ref 3.5–5.2)
Alkaline Phosphatase: 76 U/L (ref 39–117)
BILIRUBIN DIRECT: 0.1 mg/dL (ref 0.0–0.3)
BILIRUBIN TOTAL: 0.6 mg/dL (ref 0.2–1.2)
Total Protein: 6.5 g/dL (ref 6.0–8.3)

## 2018-02-16 LAB — CBC WITH DIFFERENTIAL/PLATELET
BASOS PCT: 0.6 % (ref 0.0–3.0)
Basophils Absolute: 0 10*3/uL (ref 0.0–0.1)
EOS ABS: 0.5 10*3/uL (ref 0.0–0.7)
Eosinophils Relative: 8.7 % — ABNORMAL HIGH (ref 0.0–5.0)
HEMATOCRIT: 44.2 % (ref 39.0–52.0)
Hemoglobin: 15.5 g/dL (ref 13.0–17.0)
LYMPHS PCT: 21.7 % (ref 12.0–46.0)
Lymphs Abs: 1.2 10*3/uL (ref 0.7–4.0)
MCHC: 35.1 g/dL (ref 30.0–36.0)
MCV: 90.2 fl (ref 78.0–100.0)
MONOS PCT: 10.1 % (ref 3.0–12.0)
Monocytes Absolute: 0.6 10*3/uL (ref 0.1–1.0)
NEUTROS ABS: 3.4 10*3/uL (ref 1.4–7.7)
NEUTROS PCT: 58.9 % (ref 43.0–77.0)
PLATELETS: 234 10*3/uL (ref 150.0–400.0)
RBC: 4.91 Mil/uL (ref 4.22–5.81)
RDW: 12.5 % (ref 11.5–15.5)
WBC: 5.7 10*3/uL (ref 4.0–10.5)

## 2018-02-16 LAB — LIPID PANEL
CHOL/HDL RATIO: 4
Cholesterol: 134 mg/dL (ref 0–200)
HDL: 32.2 mg/dL — ABNORMAL LOW (ref >39.00)
LDL CALC: 89 mg/dL (ref 0–99)
NonHDL: 101.68
TRIGLYCERIDES: 62 mg/dL (ref 0.0–149.0)
VLDL: 12.4 mg/dL (ref 0.0–40.0)

## 2018-02-16 LAB — POCT URINALYSIS DIPSTICK
BILIRUBIN UA: NEGATIVE
Blood, UA: NEGATIVE
Glucose, UA: NEGATIVE
KETONES UA: NEGATIVE
Leukocytes, UA: NEGATIVE
Nitrite, UA: NEGATIVE
Protein, UA: NEGATIVE
Urobilinogen, UA: 0.2 E.U./dL
pH, UA: 6 (ref 5.0–8.0)

## 2018-02-16 LAB — TSH: TSH: 1.42 u[IU]/mL (ref 0.35–4.50)

## 2018-02-16 LAB — PSA: PSA: 0.47 ng/mL (ref 0.10–4.00)

## 2018-02-16 NOTE — Progress Notes (Signed)
Annette StableBill is a 52 year old married male nonsmoker Pensions consultanttechnician for [employer] who comes in today for general physical examination  He has a history of allergic rhinitis and takes Claritin-D. He's noticed decrease in his urinary stream. Advised on use plain Claritin.  He has mild ED for each uses Viagra when necessary  He's having difficulty with indigestion. He describes a burning sensation and he points to his xiphoid area. Says sometimes it radiates up into his esophagus. Is somewhat related to meals. He does have light skin and light eyes family history negative for gallbladder disease. But his symptoms are different food related. He's tried over-the-counter Zantac with no relief  He has a bunion in his right toe  He has a lesion on his right cheek in his left thigh he would like checked.  Tetanus booster given today  14 point review of systems reviewed and otherwise negative..  Vs BP 118/88 (BP Location: Left Arm, Patient Position: Sitting, Cuff Size: Large)   Pulse 68   Temp 98.1 F (36.7 C) (Oral)   Ht 6' (1.829 m)   Wt 227 lb (103 kg)   BMI 30.79 kg/m  Well-developed well-nourished male no acute distress vital signs stable he is afebrile HEENT were negative neck was supple thyroid not enlarged no carotid bruits cardiopulmonary exam normal. Abdominal exam normal. Genitalia normal circumcised male. Rectal normal stool guaiac-negative prostate normal extremities normal skin normal peripheral pulses normal. Lesion on his right cheek is is benign. It's browns mall 2 mm. Margins are smooth no abnormal pigmentation.  The lesion on his left inner thigh is a inclusion cyst  #1 healthy male  #2 indigestion............ Food related......Marland Kitchen. Rule out gallbladder disease..... Ultrasound...Marland Kitchen.Marland Kitchen.Marland Kitchen. Trial ofPrilosec  #3 mild ED...... Continue Viagra when necessary.

## 2018-02-16 NOTE — Patient Instructions (Signed)
Labs today.......... I will call you if is anything abnormal  OTC Prilosec.... 20 mg.......Marland Kitchen. 1 tablet before breakfast and one tablet before your evening male  Fat-free diet  We will set you up for a ultrasound of your gallbladder......... I will call you the report  For your allergic rhinitis you can use plain Claritin and Allegra or Zyrtec,,,,,,,,,,, no D

## 2018-02-19 ENCOUNTER — Ambulatory Visit
Admission: RE | Admit: 2018-02-19 | Discharge: 2018-02-19 | Disposition: A | Discharge status: Home / Self Care | Payer: Managed Care, Other (non HMO) | Source: Ambulatory Visit | Attending: Family Medicine | Admitting: Family Medicine

## 2018-02-19 DIAGNOSIS — K30 Functional dyspepsia: Secondary | ICD-10-CM

## 2018-02-22 ENCOUNTER — Other Ambulatory Visit: Payer: Self-pay | Admitting: Family Medicine

## 2018-02-22 DIAGNOSIS — R109 Unspecified abdominal pain: Secondary | ICD-10-CM

## 2018-02-22 DIAGNOSIS — K30 Functional dyspepsia: Secondary | ICD-10-CM

## 2018-03-03 ENCOUNTER — Ambulatory Visit (HOSPITAL_COMMUNITY): Payer: Managed Care, Other (non HMO)

## 2018-03-05 ENCOUNTER — Encounter: Payer: Self-pay | Admitting: Family Medicine

## 2018-03-05 ENCOUNTER — Other Ambulatory Visit: Payer: Self-pay | Admitting: Family Medicine

## 2018-03-05 DIAGNOSIS — R202 Paresthesia of skin: Secondary | ICD-10-CM

## 2018-03-09 ENCOUNTER — Encounter: Payer: Self-pay | Admitting: Neurology

## 2018-03-09 ENCOUNTER — Ambulatory Visit (INDEPENDENT_AMBULATORY_CARE_PROVIDER_SITE_OTHER): Payer: Managed Care, Other (non HMO) | Admitting: Neurology

## 2018-03-09 ENCOUNTER — Other Ambulatory Visit: Payer: Self-pay

## 2018-03-09 VITALS — BP 116/80 | HR 76 | Ht 73.0 in | Wt 226.0 lb

## 2018-03-09 DIAGNOSIS — R200 Anesthesia of skin: Secondary | ICD-10-CM

## 2018-03-09 NOTE — Progress Notes (Signed)
NEUROLOGY CONSULTATION NOTE  Austin Bonilla MRN: [id number] DOB: [birth year]  Referring provider: Dr. Governor Specking Primary care provider:   Reason for consult:  Left facial tingling  Dear Dr Tawanna Cooler:  Thank you for your kind referral of Austin Bonilla for consultation of the above symptoms. Although his history is well known to you, please allow me to reiterate it for the purpose of our medical record. Records and images were personally reviewed where available.   HISTORY OF PRESENT ILLNESS: This is a very pleasant 52 year old right-handed man with no significant past medical history presenting for evaluation of left facial numbness. The first episode occurred at the end of February while driving home from work at night he started noticing the left side of his face was numb and tingling. This lasted a few hours until he went to bed, then woke up fine the next day. He had another episode a few weeks later, again lasting a few hours. He recalls sitting in his living room one time when suddenly the left side of his face went numb again. He reports the last episode was Sunday and was really bad (more intense), this time the symptoms have not completely resolved. The numbness and tingling is not as intense, but has been constant since Sunday, from the left parietal region down his left cheek and jawline. Forehead is not affected. His left arm and leg are unaffected. There is no associated headache, diplopia/vision changes, dysarthria/dysphagia, dizziness, neck/back pain, speech or comprehension difficulties. He denies any recent illness or travels. No palpitations, chest pain, or shortness of breath. No prior similar episodes before February 2019. No family history of stroke. His last LDL last month was 89, HbA1c last year was 5.6. BP today  is 116/80.  PAST MEDICAL HISTORY: Past Medical History:  Diagnosis Date  . ALLERGIC RHINITIS 08/02/2007  . Allergy    SEASONAL  . 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 LOW BACK PAIN 08/02/2007  . RESTLESS LEG SYNDROME, SEVERE 03/28/2008    PAST SURGICAL HISTORY: Past Surgical History:  Procedure Laterality Date  . APPENDECTOMY    . TONSILLECTOMY      MEDICATIONS: Current Outpatient Medications on File Prior to Visit  Medication Sig Dispense Refill  . cyclobenzaprine (FLEXERIL) 10 MG tablet 1 tablet at bedtime for low back pain 60 tablet 3  . Lysine 1000 MG TABS Take by mouth.    . Multiple Vitamin (DAILY-VITAMIN PO) Take by mouth.    . NON FORMULARY MALE HEALTH - TESTOSTERONE BOOSTER    . predniSONE (DELTASONE) 20 MG tablet 2 tabs x 3 days, 1 tab x 3 days, 1/2 tab x 3 days, 1/2 tab M,W,F x 2 weeks 40 tablet 1  . sildenafil (VIAGRA) 50 MG tablet Take 1 tablet (50 mg total) by mouth as needed for erectile dysfunction. 10 tablet 11  . traMADol (ULTRAM) 50 MG tablet 1 tablet at bedtime for low back pain 30 tablet 2   Current Facility-Administered Medications on File Prior to Visit  Medication Dose Route Frequency Provider Last Rate Last Dose  . 0.9 %  sodium chloride infusion  500 mL Intravenous Continuous Hilarie Fredrickson, MD        ALLERGIES: Allergies  Allergen Reactions  . Penicillins   . Sulfamethoxazole-Trimethoprim     FAMILY HISTORY: Family History  Problem Relation Age of Onset  . Hypertension Other   . Kidney disease Other   . Aneurysm Other   . Colon polyps  Father   . Hypertension Father   . Kidney disease Brother   . Pancreatic cancer Paternal Uncle     SOCIAL HISTORY: Social History   Socioeconomic History  . Marital status: Married    Spouse name: Not on file  . Number of children: Not on file  . Years of education: Not on file  . Highest education level: Not on file  Occupational History  . Not on file  Social Needs  . Financial resource strain: Not on file  . Food insecurity:    Worry: Not on file    Inability: Not on file  . Transportation needs:    Medical: Not on file    Non-medical:  Not on file  Tobacco Use  . Smoking status: Never Smoker  . Smokeless tobacco: Never Used  Substance and Sexual Activity  . Alcohol use: No    Alcohol/week: 0.0 oz  . Drug use: No  . Sexual activity: Not on file  Lifestyle  . Physical activity:    Days per week: Not on file    Minutes per session: Not on file  . Stress: Not on file  Relationships  . Social connections:    Talks on phone: Not on file    Gets together: Not on file    Attends religious service: Not on file    Active member of club or organization: Not on file    Attends meetings of clubs or organizations: Not on file    Relationship status: Not on file  . Intimate partner violence:    Fear of current or ex partner: Not on file    Emotionally abused: Not on file    Physically abused: Not on file    Forced sexual activity: Not on file  Other Topics Concern  . Not on file  Social History Narrative  . Not on file    REVIEW OF SYSTEMS: Constitutional: No fevers, chills, or sweats, no generalized fatigue, change in appetite Eyes: No visual changes, double vision, eye pain Ear, nose and throat: No hearing loss, ear pain, nasal congestion, sore throat Cardiovascular: No chest pain, palpitations Respiratory:  No shortness of breath at rest or with exertion, wheezes GastrointestinaI: No nausea, vomiting, diarrhea, abdominal pain, fecal incontinence Genitourinary:  No dysuria, urinary retention or frequency Musculoskeletal:  No neck pain, back pain Integumentary: No rash, pruritus, skin lesions Neurological: as above Psychiatric: No depression, insomnia, anxiety Endocrine: No palpitations, fatigue, diaphoresis, mood swings, change in appetite, change in weight, increased thirst Hematologic/Lymphatic:  No anemia, purpura, petechiae. Allergic/Immunologic: no itchy/runny eyes, nasal congestion, recent allergic reactions, rashes  PHYSICAL EXAM: Vitals:   03/09/18 1022  BP: 116/80  Pulse: 76  SpO2: 97%   General:  No acute distress Head:  Normocephalic/atraumatic Eyes: Fundoscopic exam shows bilateral sharp discs, no vessel changes, exudates, or hemorrhages Neck: supple, no paraspinal tenderness, full range of motion Back: No paraspinal tenderness Heart: regular rate and rhythm Lungs: Clear to auscultation bilaterally. Vascular: No carotid bruits. Skin/Extremities: No rash, no edema Neurological Exam: Mental status: alert and oriented to person, place, and time, no dysarthria or aphasia, Fund of knowledge is appropriate.  Recent and remote memory are intact.  Attention and concentration are normal.    Able to name objects and repeat phrases. Cranial nerves: CN I: not tested CN II: pupils equal, round and reactive to light, visual fields intact, fundi unremarkable. CN III, IV, VI:  full range of motion, no nystagmus, no ptosis CN V: decreased light touch  and cold sensation on left V1-3, intact pin CN VII: upper and lower face symmetric CN VIII: hearing intact to finger rub CN IX, X: gag intact, uvula midline CN XI: sternocleidomastoid and trapezius muscles intact CN XII: tongue midline Bulk & Tone: normal, no fasciculations. Motor: 5/5 throughout with no pronator drift. Sensation: intact to light touch, cold, pin on both UE, decreased cold, pin, and vibration sense on left LE (reports it feels "different"). Romberg test negative Deep Tendon Reflexes: +1 throughout, no ankle clonus Plantar responses: withdraws on left, downgoing on right Cerebellar: no incoordination on finger to nose testing Gait: narrow-based and steady, able to tandem walk adequately. Tremor: none  IMPRESSION: This is a very pleasant 52 year old right-handed man with no significant past medical history, no clear vascular risk factors, presenting with left facial numbness and tingling. Initially symptoms were occurring intermittently for a few hours since February, however since the last episode 2 days ago, the numbness/tingling  has been constant on the left side of his face. Neurological exam shows decreased sensation on the left side of face and left lower extremity. Symptoms concerning for a stuttering TIA initially and now possibly a small stroke. MRI brain with and without contrast will be ordered to assess for underlying structural abnormality. He was instructed to start a daily aspirin . His LDL and recent glucose level were normal, BP today 116/80, continue to control vascular risk factors. He knows to go to the ER immediately for any sudden change in symptoms. If MRI brain shows evidence of stroke, stroke workup with echocardiogram and carotid ultrasound will be ordered. He will follow-up in 3-4 months and knows to call for any changes.   Thank you for allowing me to participate in the care of this patient. Please do not hesitate to call for any questions or concerns.   Patrcia Dolly, M.D.  CC: Dr. Tawanna Cooler

## 2018-03-09 NOTE — Patient Instructions (Addendum)
1. Schedule MRI brain with and without contrast 2. Start daily aspirin  3. For any sudden change in symptoms, go to ER immediately

## 2018-03-15 ENCOUNTER — Ambulatory Visit
Admission: RE | Admit: 2018-03-15 | Discharge: 2018-03-15 | Disposition: A | Discharge status: Home / Self Care | Payer: Managed Care, Other (non HMO) | Source: Ambulatory Visit | Attending: Neurology | Admitting: Neurology

## 2018-03-15 ENCOUNTER — Ambulatory Visit (HOSPITAL_COMMUNITY): Admission: RE | Admit: 2018-03-15 | Payer: Managed Care, Other (non HMO) | Source: Ambulatory Visit

## 2018-03-15 ENCOUNTER — Other Ambulatory Visit: Payer: Managed Care, Other (non HMO)

## 2018-03-15 DIAGNOSIS — R200 Anesthesia of skin: Secondary | ICD-10-CM

## 2018-03-15 MED ORDER — GADOBENATE DIMEGLUMINE 529 MG/ML IV SOLN
20.0000 mL | Freq: Once | INTRAVENOUS | Status: AC | PRN
  Administered 2018-03-15: 20 mL via INTRAVENOUS

## 2018-03-17 ENCOUNTER — Telehealth: Payer: Self-pay | Admitting: Neurology

## 2018-03-17 NOTE — Telephone Encounter (Signed)
Pt returned call and left a better number to call CB# [phone]

## 2018-03-17 NOTE — Telephone Encounter (Signed)
Left VM, asked patient to call back and discuss normal MRI brain results.

## 2018-03-17 NOTE — Telephone Encounter (Signed)
Discussed normal MRI brain with patient. He continues to have the numbness on the left side of his face, discussed that symptoms possibly MRI-negative stroke since it is such a small area affected (left side of face). We discussed continuing daily aspirin and control of vascular risk factors, close clinical monitoring. Discussed symptomatic treatment of paresthesias with gabapentin, discussed side effects, he does not want to start medication at this time. He knows to call for any changes.

## 2018-04-13 ENCOUNTER — Encounter (HOSPITAL_COMMUNITY): Payer: Self-pay

## 2018-04-13 ENCOUNTER — Emergency Department (HOSPITAL_COMMUNITY)
Admission: EM | Admit: 2018-04-13 | Discharge: 2018-04-13 | Disposition: A | Discharge status: Home Health | Payer: Managed Care, Other (non HMO) | Attending: Emergency Medicine | Admitting: Emergency Medicine

## 2018-04-13 ENCOUNTER — Other Ambulatory Visit: Payer: Self-pay

## 2018-04-13 DIAGNOSIS — T452X5A Adverse effect of vitamins, initial encounter: Secondary | ICD-10-CM | POA: Insufficient documentation

## 2018-04-13 DIAGNOSIS — L271 Localized skin eruption due to drugs and medicaments taken internally: Secondary | ICD-10-CM | POA: Insufficient documentation

## 2018-04-13 DIAGNOSIS — R21 Rash and other nonspecific skin eruption: Secondary | ICD-10-CM | POA: Insufficient documentation

## 2018-04-13 DIAGNOSIS — Z79899 Other long term (current) drug therapy: Secondary | ICD-10-CM | POA: Diagnosis not present

## 2018-04-13 DIAGNOSIS — T50905A Adverse effect of unspecified drugs, medicaments and biological substances, initial encounter: Secondary | ICD-10-CM

## 2018-04-13 MED ORDER — DIPHENHYDRAMINE HCL 50 MG/ML IJ SOLN
25.0000 mg | Freq: Once | INTRAMUSCULAR | Status: AC
  Administered 2018-04-13: 25 mg via INTRAVENOUS
  Filled 2018-04-13: qty 1

## 2018-04-13 NOTE — ED Provider Notes (Signed)
[facility]-EMERGENCY DEPT Provider Note   CSN: [id number] Arrival date & time: 04/13/18  1551     History   Chief Complaint Chief Complaint  Patient presents with  . Rash    HPI Austin Bonilla is a 52 y.o. male.  52yo male brought in by EMS for possible allergic reaction. Patient states he took Niacin and nitric oxide today around 12PM (currently taking Viagra, researched alternatives and took these supplements today for this purpose). Patient took his normal L-lysine around 2PM and then experienced intense skin flushing and burning/itching which concerned him and prompted him to call 911. Patient was not given anything by EMS, states he did take an unknown antihistamine at home without relief. Rash/flushing has nearly completely resolved. No complaints of abdominal pain/nausea/vomiting/tongue swelling/SHOB. No other complaints or concerns.      Past Medical History:  Diagnosis Date  . ALLERGIC RHINITIS 08/02/2007  . Allergy    SEASONAL  . 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 LOW BACK PAIN 08/02/2007  . RESTLESS LEG SYNDROME, SEVERE 03/28/2008    Patient Active Problem List   Diagnosis Date Noted  . Mild dietary indigestion 02/16/2018  . Family history of aortic aneurysm 01/27/2017  . Erectile dysfunction 12/03/2015  . Routine general medical examination at a health care facility 12/12/2013  . Allergic rhinitis 08/02/2007    Past Surgical History:  Procedure Laterality Date  . APPENDECTOMY    . TONSILLECTOMY          Home Medications    Prior to Admission medications   Medication Sig Start Date End Date Taking? Authorizing Provider  cyclobenzaprine (FLEXERIL) 10 MG tablet 1 tablet at bedtime for low back pain Patient taking differently: 1 tablet at bedtime for low back pain 01/27/17   Roderick Pee, MD  Lysine 1000 MG TABS Take by mouth.    [provider]  Multiple Vitamin (DAILY-VITAMIN PO) Take by mouth.     [provider]  NON FORMULARY MALE HEALTH - TESTOSTERONE BOOSTER    [provider]  predniSONE (DELTASONE) 20 MG tablet 2 tabs x 3 days, 1 tab x 3 days, 1/2 tab x 3 days, 1/2 tab M,W,F x 2 weeks 01/27/17   Roderick Pee, MD  sildenafil (VIAGRA) 50 MG tablet Take 1 tablet (50 mg total) by mouth as needed for erectile dysfunction. 01/27/17   Roderick Pee, MD  traMADol Janean Sark) 50 MG tablet 1 tablet at bedtime for low back pain Patient taking differently: 1 tablet at bedtime for low back pain 01/27/17   Roderick Pee, MD    Family History Family History  Problem Relation Age of Onset  . Hypertension Other   . Kidney disease Other   . Aneurysm Other   . Colon polyps Father   . Hypertension Father   . Kidney disease Brother   . Pancreatic cancer Paternal Uncle     Social History Social History   Tobacco Use  . Smoking status: Never Smoker  . Smokeless tobacco: Never Used  Substance Use Topics  . Alcohol use: No    Alcohol/week: 0.0 oz  . Drug use: No     Allergies   Penicillins and Sulfamethoxazole-trimethoprim   Review of Systems Review of Systems  Constitutional: Negative for fever.  HENT: Negative for sore throat and trouble swallowing.   Respiratory: Negative for shortness of breath.   Cardiovascular: Negative for chest pain.  Gastrointestinal: Negative for abdominal pain, nausea and vomiting.  Skin:  Positive for rash.  Allergic/Immunologic: Negative for immunocompromised state.  Neurological: Negative for dizziness and headaches.  Hematological: Does not bruise/bleed easily.  Psychiatric/Behavioral: Negative for confusion.  All other systems reviewed and are negative.    Physical Exam Updated Vital Signs BP (!) 153/113   Pulse 66   Temp 98.1 F (36.7 C) (Oral)   Resp 18   Ht 6\' 1"  (1.854 m)   Wt 99.8 kg (220 lb)   SpO2 100%   BMI 29.03 kg/m   Physical Exam  Constitutional: He is oriented to person, place, and time. He appears  well-developed and well-nourished. No distress.  HENT:  Head: Normocephalic.  Eyes: Conjunctivae are normal.  Cardiovascular: Normal rate, regular rhythm, normal heart sounds and intact distal pulses.  No murmur heard. Pulmonary/Chest: Effort normal and breath sounds normal. No respiratory distress.  Neurological: He is alert and oriented to person, place, and time.  Skin: Skin is warm and dry. Capillary refill takes less than 2 seconds. Rash noted. He is not diaphoretic.  Slight flushing to upper extremities.   Psychiatric: He has a normal mood and affect.  Nursing note and vitals reviewed.    ED Treatments / Results  Labs (all labs ordered are listed, but only abnormal results are displayed) Labs Reviewed - No data to display  EKG None  Radiology No results found.  Procedures Procedures (including critical care time)  Medications Ordered in ED Medications  diphenhydrAMINE (BENADRYL) injection 25 mg (has no administration in time range)     Initial Impression / Assessment and Plan / ED Course  I have reviewed the triage vital signs and the nursing notes.  Pertinent labs & imaging results that were available during my care of the patient were reviewed by me and considered in my medical decision making (see chart for details).  Clinical Course as of Apr 14 1635  Tue Apr 13, 2018  1635 Flushing reaction after taking Niacin, resolved. Recommend dc the niacin, benadryl PRN. See PCP as needed, return to ER for any worsening or concerning symptoms.    [LM]    Clinical Course User Index [LM] Jeannie FendMurphy, Laura A, PA-C      Final Clinical Impressions(s) / ED Diagnoses   Final diagnoses:  Adverse effect of drug, initial encounter    ED Discharge Orders    None       Alden HippMurphy, Laura A, PA-C 04/13/18 1637    Mesner, Barbara CowerJason, MD 04/13/18 1652

## 2018-04-13 NOTE — Discharge Instructions (Addendum)
STOP taking the Niacin. Take Benadryl as needed for rash/itching. Return to the ER for worsening or concerning symptoms.

## 2018-04-13 NOTE — ED Notes (Signed)
Bed: [id number] Expected date:  Expected time:  Means of arrival:  Comments: 52 yo m supplement reaction

## 2018-04-13 NOTE — ED Triage Notes (Signed)
EMS reports from home called for medication reaction, began taking new supplemnets today including niacin and nitric oxide, pt began burning sensation skin redness and itching. No SOB , hives or allergic reaction symptoms on scene.  BP 132P HR 72 Resp 18 Sp02 96 RA   18ga LAC

## 2018-05-31 ENCOUNTER — Encounter: Payer: Self-pay | Admitting: Podiatry

## 2018-05-31 ENCOUNTER — Ambulatory Visit (INDEPENDENT_AMBULATORY_CARE_PROVIDER_SITE_OTHER): Payer: Managed Care, Other (non HMO) | Admitting: Podiatry

## 2018-05-31 ENCOUNTER — Ambulatory Visit (INDEPENDENT_AMBULATORY_CARE_PROVIDER_SITE_OTHER): Payer: Managed Care, Other (non HMO)

## 2018-05-31 DIAGNOSIS — M21611 Bunion of right foot: Secondary | ICD-10-CM | POA: Diagnosis not present

## 2018-05-31 DIAGNOSIS — M21619 Bunion of unspecified foot: Secondary | ICD-10-CM

## 2018-05-31 DIAGNOSIS — M7751 Other enthesopathy of right foot: Secondary | ICD-10-CM

## 2018-05-31 DIAGNOSIS — M779 Enthesopathy, unspecified: Secondary | ICD-10-CM

## 2018-05-31 MED ORDER — TRIAMCINOLONE ACETONIDE 10 MG/ML IJ SUSP
10.0000 mg | Freq: Once | INTRAMUSCULAR | Status: AC
  Administered 2018-05-31: 10 mg

## 2018-05-31 NOTE — Patient Instructions (Signed)
Hallux Rigidus Hallux rigidus is a type of joint pain or joint disease (arthritis) that affects your big toe (hallux). This condition involves the joint that connects the base of your big toe to the main part of your foot (metatarsophalangeal joint). This condition can cause your big toe to become stiff, painful, and difficult to move. Symptoms may get worse with movement or in cold or damp weather. The condition also gets worse over time. What are the causes? This condition may be caused by having a foot that does not function the way that it should or has an abnormal shape (structural deformity). These foot problems can run in families (be hereditary). This condition can also be caused by:  Injury.  Overuse.  Certain inflammatory diseases, including gout and rheumatoid arthritis.  What increases the risk? This condition is more likely to develop in people who:  Have a foot bone (metatarsal) that is longer or higher than normal.  Have a family history of hallux rigidus.  Have previously injured their big toe.  Have feet that do not have a curve (arch) on the inner side of the foot. This may be called flat feet or fallen arches.  Turn their ankles in when they walk (pronation).  Have rheumatoid arthritis or gout.  Have to stoop down often at work.  What are the signs or symptoms? Symptoms of this condition include:  Big toe pain.  Stiffness and difficulty moving the big toe.  Swelling of the toe and surrounding area.  Bone spurs. These are bony growths that can form on the joint of the big toe.  A limp.  How is this diagnosed? This condition is diagnosed based on a medical history and physical exam. This may include X-rays. How is this treated? Treatment for this condition includes:  Wearing roomy, comfortable shoes that have a large toe box.  Putting orthotic devices in your shoes.  Pain medicines.  Physical therapy.  Icing the injured area.  Alternate  between putting your foot in cold water then warm water.  If your condition is severe, treatment may include:  Corticosteroid injections to relieve pain.  Surgery to remove bone spurs, fuse damaged bones together, or replace the entire joint.  Follow these instructions at home:  Take over-the-counter and prescription medicines only as told by your health care provider.  Do not wear high heels or other restrictive footwear. Wear comfortable, supportive shoes that have a large toe box.  Wear orthotics as told by your health care provider, if this applies.  Put your feet in cold water for 30 seconds, then in warm water for 30 seconds. Alternate between the cold and warm water for 5 minutes. Do this several times a day or as told by your health care provider.  If directed, apply ice to the injured area. ? Put ice in a plastic bag. ? Place a towel between your skin and the bag. ? Leave the ice on for 20 minutes, 2-3 times per day.  Do foot exercises as instructed by your health care provider or a physical therapist.  Keep all follow-up visits as told by your health care provider. This is important. Contact a health care provider if:  You notice bone spurs or growths on or around your big toe.  Your pain does not get better or it gets worse.  You have pain while resting.  You have pain in other parts of your body, such as your back, hip, or knee.  You start to limp.   This information is not intended to replace advice given to you by your health care provider. Make sure you discuss any questions you have with your health care provider. Document Released: 10/20/2005 Document Revised: 03/27/2016 Document Reviewed: 06/27/2015 Elsevier Interactive Patient Education  2018 [employer]vier Inc[employer]  

## 2018-06-01 NOTE — Progress Notes (Signed)
Subjective:   Patient ID: Austin SellaWilliam M Bonilla, male   DOB: 52 y.o.   MRN: [id number]   HPI Patient presents with significant discomfort in the big toe joint right and knows that he is lost motion is been going on for a number of years and he is tried different shoe gear modifications soaks and reduced activity and walking differently.  Patient does not smoke likes to be active   Review of Systems  All other systems reviewed and are negative.       Objective:  Physical Exam  Constitutional: He appears well-developed and well-nourished.  Cardiovascular: Intact distal pulses.  Pulmonary/Chest: Effort normal.  Musculoskeletal: Normal range of motion.  Neurological: He is alert.  Skin: Skin is warm.  Nursing note and vitals reviewed.   Neurovascular status intact muscle strength adequate range of motion within normal limits with patient found to have reduced range of motion of the first MPJ right with pain around the head swelling and compensation in the gait pattern.  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  He wants to get this done but needs to work on his schedule at work and he will call and schedule his surgery in the next several weeks.  Patient was given all information concerning surgical intervention in this particular case  X-ray indicates significant elongation first metatarsal right with elevation and dorsal spur formation

## 2019-08-25 DIAGNOSIS — Z20828 Contact with and (suspected) exposure to other viral communicable diseases: Secondary | ICD-10-CM | POA: Diagnosis not present

## 2019-08-26 ENCOUNTER — Encounter: Payer: Managed Care, Other (non HMO) | Admitting: Family Medicine

## 2019-10-13 ENCOUNTER — Encounter: Payer: Self-pay | Admitting: Family Medicine

## 2019-10-13 ENCOUNTER — Other Ambulatory Visit: Payer: Self-pay

## 2019-10-13 ENCOUNTER — Ambulatory Visit (INDEPENDENT_AMBULATORY_CARE_PROVIDER_SITE_OTHER): Payer: BC Managed Care – PPO | Admitting: Family Medicine

## 2019-10-13 VITALS — BP 148/98 | HR 66 | Temp 98.2°F | Ht 73.0 in | Wt 249.2 lb

## 2019-10-13 DIAGNOSIS — Z0001 Encounter for general adult medical examination with abnormal findings: Secondary | ICD-10-CM | POA: Diagnosis not present

## 2019-10-13 DIAGNOSIS — J309 Allergic rhinitis, unspecified: Secondary | ICD-10-CM

## 2019-10-13 DIAGNOSIS — Z13220 Encounter for screening for lipoid disorders: Secondary | ICD-10-CM

## 2019-10-13 DIAGNOSIS — I10 Essential (primary) hypertension: Secondary | ICD-10-CM | POA: Diagnosis not present

## 2019-10-13 DIAGNOSIS — Z125 Encounter for screening for malignant neoplasm of prostate: Secondary | ICD-10-CM | POA: Diagnosis not present

## 2019-10-13 DIAGNOSIS — R030 Elevated blood-pressure reading, without diagnosis of hypertension: Secondary | ICD-10-CM

## 2019-10-13 LAB — COMPREHENSIVE METABOLIC PANEL
ALT: 26 U/L (ref 0–53)
AST: 18 U/L (ref 0–37)
Albumin: 4.4 g/dL (ref 3.5–5.2)
Alkaline Phosphatase: 84 U/L (ref 39–117)
BUN: 12 mg/dL (ref 6–23)
CO2: 27 mEq/L (ref 19–32)
Calcium: 9.2 mg/dL (ref 8.4–10.5)
Chloride: 106 mEq/L (ref 96–112)
Creatinine, Ser: 0.98 mg/dL (ref 0.40–1.50)
GFR: 79.51 mL/min (ref >60.00)
Glucose, Bld: 86 mg/dL (ref 70–99)
Potassium: 4.5 mEq/L (ref 3.5–5.1)
Sodium: 141 mEq/L (ref 135–145)
Total Bilirubin: 0.5 mg/dL (ref 0.2–1.2)
Total Protein: 6.8 g/dL (ref 6.0–8.3)

## 2019-10-13 LAB — CBC
HCT: 45.5 % (ref 39.0–52.0)
Hemoglobin: 15.4 g/dL (ref 13.0–17.0)
MCHC: 33.9 g/dL (ref 30.0–36.0)
MCV: 91.2 fl (ref 78.0–100.0)
Platelets: 264 10*3/uL (ref 150.0–400.0)
RBC: 4.99 Mil/uL (ref 4.22–5.81)
RDW: 12.8 % (ref 11.5–15.5)
WBC: 6.3 10*3/uL (ref 4.0–10.5)

## 2019-10-13 LAB — LIPID PANEL
Cholesterol: 160 mg/dL (ref 0–200)
HDL: 32 mg/dL — ABNORMAL LOW (ref >39.00)
LDL Cholesterol: 101 mg/dL — ABNORMAL HIGH (ref 0–99)
NonHDL: 128.36
Total CHOL/HDL Ratio: 5
Triglycerides: 139 mg/dL (ref 0.0–149.0)
VLDL: 27.8 mg/dL (ref 0.0–40.0)

## 2019-10-13 LAB — PSA: PSA: 0.42 ng/mL (ref 0.10–4.00)

## 2019-10-13 LAB — TSH: TSH: 1.98 u[IU]/mL (ref 0.35–4.50)

## 2019-10-13 MED ORDER — LOSARTAN POTASSIUM 100 MG PO TABS: 100.0000 mg | ORAL_TABLET | Freq: Every day | ORAL | 3 refills | Status: AC

## 2019-10-13 MED ORDER — AZELASTINE HCL 0.1 % NA SOLN: 2.0000 | Freq: Two times a day (BID) | NASAL | 12 refills | Status: AC

## 2019-10-13 NOTE — Assessment & Plan Note (Signed)
Possibly contributing to his headaches.  Will start Astelin nasal spray.  If continues to have headaches despite treatment for allergic tinnitus, would consider referral back to neurology.

## 2019-10-13 NOTE — Progress Notes (Signed)
Chief Complaint:  Austin Bonilla is a 52 y.o. male who presents today for his annual comprehensive physical exam and to transfer care.  Assessment/Plan:  Elevated blood pressure reading Above goal per JNC 8.  Advised him to continue home monitoring goal 140/90 or lower.  Discussed lifestyle modifications including low-salt diet and regular exercise.  Will send in losartan 100 mg daily if continues to have elevated blood pressure readings.  Check CBC, CMP, TSH.  Allergic rhinitis Possibly contributing to his headaches.  Will start Astelin nasal spray.  If continues to have headaches despite treatment for allergic tinnitus, would consider referral back to neurology.   Body mass index is 32.88 kg/m. / Obese BMI Metric Follow Up - 10/13/19 1248      BMI Metric Follow Up-Please document annually   BMI Metric Follow Up  Education provided       Preventative Healthcare: Check CBC, C met, TSH, and lipid panel.  Patient Counseling(The following topics were reviewed and/or handout was given):  -Nutrition: Stressed importance of moderation in sodium/caffeine intake, saturated fat and cholesterol, caloric balance, sufficient intake of fresh fruits, vegetables, and fiber.  -Stressed the importance of regular exercise.   -Substance Abuse: Discussed cessation/primary prevention of tobacco, alcohol, or other drug use; driving or other dangerous activities under the influence; availability of treatment for abuse.   -Injury prevention: Discussed safety belts, safety helmets, smoke detector, smoking near bedding or upholstery.   -Sexuality: Discussed sexually transmitted diseases, partner selection, use of condoms, avoidance of unintended pregnancy and contraceptive alternatives.   -Dental health: Discussed importance of regular tooth brushing, flossing, and dental visits.  -Health maintenance and immunizations reviewed. Please refer to Health maintenance section.  Return to care in 1 year for next  preventative visit.     Subjective:  HPI:  He has no acute complaints today.   Has a history of chronic headaches.  Usually due to allergies.  Worsened recently.  Is tried over-the-counter meds with modest improvement.  No reported weakness or numbness.  He also notes that he is holding his breath when doing activity.  This occasionally makes him feel short of breath.  Does not have any issues at rest.  No chest pain.  His stable, chronic medical conditions are outlined below:   # Erectile Dysfunction Uses sildenafil as needed  # Low Back Pain / Left Knee Pain Uses tramadol as needed  # GERD Uses nexium as needed  Lifestyle Diet: None Exercise: Limited due to work.   Depression screen PHQ 2/9 10/13/2019  Decreased Interest 0  Down, Depressed, Hopeless 0  PHQ - 2 Score 0   Health Maintenance Due  Topic Date Due  . HIV Screening  02/13/1980    ROS: Per HPI, otherwise a complete review of systems was negative.   PMH:  The following were reviewed and entered/updated in epic: Past Medical History:  Diagnosis Date  . ALLERGIC RHINITIS 08/02/2007  . Allergy    SEASONAL  . 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 LOW BACK PAIN 08/02/2007  . RESTLESS LEG SYNDROME, SEVERE 03/28/2008   Patient Active Problem List   Diagnosis Date Noted  . Elevated blood pressure reading 10/13/2019  . Mild dietary indigestion 02/16/2018  . Family history of aortic aneurysm 01/27/2017  . Erectile dysfunction 12/03/2015  . Allergic rhinitis 08/02/2007   Past Surgical History:  Procedure Laterality Date  . APPENDECTOMY    . KNEE SURGERY    . TONSILLECTOMY  Family History  Problem Relation Age of Onset  . Hypertension Other   . Kidney disease Other   . Aneurysm Other   . Colon polyps Father   . Hypertension Father   . Kidney disease Brother   . Pancreatic cancer Paternal Uncle     Medications- reviewed and updated Current Outpatient Medications  Medication Sig  Dispense Refill  . Arginine 1000 MG TABS Take by mouth.    . Budesonide (RHINOCORT ALLERGY NA) Place into the nose.    Marland Kitchen CINNAMON PO Take 1,000 mg by mouth.    . cyclobenzaprine (FLEXERIL) 10 MG tablet 1 tablet at bedtime for low back pain (Patient taking differently: 1 tablet at bedtime for low back pain) 60 tablet 3  . diphenhydrAMINE-Phenylephrine (RA ALLERGY PLUS SINUS PO) Take by mouth.    . esomeprazole (NEXIUM) 20 MG capsule Take 20 mg by mouth daily at 12 noon.    . IBUPROFEN PO Take by mouth.    . Lysine 1000 MG TABS Take 2,500 mg by mouth.     . Multiple Vitamin (DAILY-VITAMIN PO) Take by mouth.    . NON FORMULARY MALE HEALTH - TESTOSTERONE BOOSTER    . sildenafil (VIAGRA) 50 MG tablet Take 1 tablet (50 mg total) by mouth as needed for erectile dysfunction. 10 tablet 11  . traMADol (ULTRAM) 50 MG tablet 1 tablet at bedtime for low back pain (Patient taking differently: 1 tablet at bedtime for low back pain) 30 tablet 2  . vitamin B-12 (CYANOCOBALAMIN) 1000 MCG tablet Take 1,000 mcg by mouth daily.    Marland Kitchen azelastine (ASTELIN) 0.1 % nasal spray Place 2 sprays into both nostrils 2 (two) times daily. 30 mL 12  . losartan (COZAAR) 100 MG tablet Take 1 tablet (100 mg total) by mouth daily. 90 tablet 3   No current facility-administered medications for this visit.    Allergies-reviewed and updated Allergies  Allergen Reactions  . Penicillins   . Sulfamethoxazole-Trimethoprim     Social History   Socioeconomic History  . Marital status: Married    Spouse name: Not on file  . Number of children: Not on file  . Years of education: Not on file  . Highest education level: Not on file  Occupational History  . Not on file  Tobacco Use  . Smoking status: Never Smoker  . Smokeless tobacco: Never Used  Substance and Sexual Activity  . Alcohol use: No    Alcohol/week: 0.0 standard drinks  . Drug use: No  . Sexual activity: Not on file  Other Topics Concern  . Not on file  Social  History Narrative  . Not on file   Social Determinants of Health   Financial Resource Strain:   . Difficulty of Paying Living Expenses: Not on file  Food Insecurity:   . Worried About Charity fundraiser in the Last Year: Not on file  . Ran Out of Food in the Last Year: Not on file  Transportation Needs:   . Lack of Transportation (Medical): Not on file  . Lack of Transportation (Non-Medical): Not on file  Physical Activity:   . Days of Exercise per Week: Not on file  . Minutes of Exercise per Session: Not on file  Stress:   . Feeling of Stress : Not on file  Social Connections:   . Frequency of Communication with Friends and Family: Not on file  . Frequency of Social Gatherings with Friends and Family: Not on file  . Attends Religious  Services: Not on file  . Active Member of Clubs or Organizations: Not on file  . Attends Archivist Meetings: Not on file  . Marital Status: Not on file        Objective:  Physical Exam: BP (!) 148/98   Pulse 66   Temp 98.2 F (36.8 C)   Ht 6' 1" (1.854 m)   Wt 249 lb 3.2 oz (113 kg)   SpO2 97%   BMI 32.88 kg/m   Body mass index is 32.88 kg/m. Wt Readings from Last 3 Encounters:  10/13/19 249 lb 3.2 oz (113 kg)  04/13/18 220 lb (99.8 kg)  03/09/18 226 lb (102.5 kg)   Gen: NAD, resting comfortably HEENT: TMs normal bilaterally. OP clear. No thyromegaly noted.  CV: RRR with no murmurs appreciated Pulm: NWOB, CTAB with no crackles, wheezes, or rhonchi GI: Normal bowel sounds present. Soft, Nontender, Nondistended. MSK: no edema, cyanosis, or clubbing noted Skin: warm, dry Neuro: CN2-12 grossly intact. Strength 5/5 in upper and lower extremities. Reflexes symmetric and intact bilaterally.  Psych: Normal affect and thought content     Caleb M. Jerline Pain, MD 10/13/2019 12:48 PM

## 2019-10-13 NOTE — Assessment & Plan Note (Signed)
Above goal per JNC 8.  Advised him to continue home monitoring goal 140/90 or lower.  Discussed lifestyle modifications including low-salt diet and regular exercise.  Will send in losartan 100 mg daily if continues to have elevated blood pressure readings.  Check CBC, CMP, TSH.

## 2019-10-13 NOTE — Patient Instructions (Signed)
It was very nice to see you today!  Please try the nasal spray and let me know if this does not help with your headaches or congestion.  Please start the blood pressure medication if your blood pressures are persistently 140/90 or higher.  Please continue working on diet and exercise.  We will check blood work today.  Come back to see me in 1 year for your next physical, or sooner if needed.  Take care, Dr Jerline Pain  Please try these tips to maintain a healthy lifestyle:   Eat at least 3 REAL meals and 1-2 snacks per day.  Aim for no more than 5 hours between eating.  If you eat breakfast, please do so within one hour of getting up.    Obtain twice as many fruits/vegetables as protein or carbohydrate foods for both lunch and dinner. (Half of each meal should be fruits/vegetables, one quarter protein, and one quarter starchy carbs)   Cut down on sweet beverages. This includes juice, soda, and sweet tea.    Exercise at least 150 minutes every week.    Preventive Care 40-13 Years Old, Male Preventive care refers to lifestyle choices and visits with your health care provider that can promote health and wellness. This includes:  A yearly physical exam. This is also called an annual well check.  Regular dental and eye exams.  Immunizations.  Screening for certain conditions.  Healthy lifestyle choices, such as eating a healthy diet, getting regular exercise, not using drugs or products that contain nicotine and tobacco, and limiting alcohol use. What can I expect for my preventive care visit? Physical exam Your health care provider will check:  Height and weight. These may be used to calculate body mass index (BMI), which is a measurement that tells if you are at a healthy weight.  Heart rate and blood pressure.  Your skin for abnormal spots. Counseling Your health care provider may ask you questions about:  Alcohol, tobacco, and drug use.  Emotional well-being.  Home  and relationship well-being.  Sexual activity.  Eating habits.  Work and work Statistician. What immunizations do I need?  Influenza (flu) vaccine  This is recommended every year. Tetanus, diphtheria, and pertussis (Tdap) vaccine  You may need a Td booster every 10 years. Varicella (chickenpox) vaccine  You may need this vaccine if you have not already been vaccinated. Zoster (shingles) vaccine  You may need this after age 61. Measles, mumps, and rubella (MMR) vaccine  You may need at least one dose of MMR if you were born in 1957 or later. You may also need a second dose. Pneumococcal conjugate (PCV13) vaccine  You may need this if you have certain conditions and were not previously vaccinated. Pneumococcal polysaccharide (PPSV23) vaccine  You may need one or two doses if you smoke cigarettes or if you have certain conditions. Meningococcal conjugate (MenACWY) vaccine  You may need this if you have certain conditions. Hepatitis A vaccine  You may need this if you have certain conditions or if you travel or work in places where you may be exposed to hepatitis A. Hepatitis B vaccine  You may need this if you have certain conditions or if you travel or work in places where you may be exposed to hepatitis B. Haemophilus influenzae type b (Hib) vaccine  You may need this if you have certain risk factors. Human papillomavirus (HPV) vaccine  If recommended by your health care provider, you may need three doses over 6 months. You  may receive vaccines as individual doses or as more than one vaccine together in one shot (combination vaccines). Talk with your health care provider about the risks and benefits of combination vaccines. What tests do I need? Blood tests  Lipid and cholesterol levels. These may be checked every 5 years, or more frequently if you are over 72 years old.  Hepatitis C test.  Hepatitis B test. Screening  Lung cancer screening. You may have this  screening every year starting at age 16 if you have a 30-pack-year history of smoking and currently smoke or have quit within the past 15 years.  Prostate cancer screening. Recommendations will vary depending on your family history and other risks.  Colorectal cancer screening. All adults should have this screening starting at age 18 and continuing until age 32. Your health care provider may recommend screening at age 42 if you are at increased risk. You will have tests every 1-10 years, depending on your results and the type of screening test.  Diabetes screening. This is done by checking your blood sugar (glucose) after you have not eaten for a while (fasting). You may have this done every 1-3 years.  Sexually transmitted disease (STD) testing. Follow these instructions at home: Eating and drinking  Eat a diet that includes fresh fruits and vegetables, whole grains, lean protein, and low-fat dairy products.  Take vitamin and mineral supplements as recommended by your health care provider.  Do not drink alcohol if your health care provider tells you not to drink.  If you drink alcohol: ? Limit how much you have to 0-2 drinks a day. ? Be aware of how much alcohol is in your drink. In the [place] one drink equals one 12 oz bottle of beer (355 mL), one 5 oz glass of wine (148 mL), or one 1 oz glass of hard liquor (44 mL). Lifestyle  Take daily care of your teeth and gums.  Stay active. Exercise for at least 30 minutes on 5 or more days each week.  Do not use any products that contain nicotine or tobacco, such as cigarettes, e-cigarettes, and chewing tobacco. If you need help quitting, ask your health care provider.  If you are sexually active, practice safe sex. Use a condom or other form of protection to prevent STIs (sexually transmitted infections).  Talk with your health care provider about taking a low-dose aspirin every day starting at age 32. What's next?  Go to your health care  provider once a year for a well check visit.  Ask your health care provider how often you should have your eyes and teeth checked.  Stay up to date on all vaccines. This information is not intended to replace advice given to you by your health care provider. Make sure you discuss any questions you have with your health care provider. Document Released: 11/16/2015 Document Revised: 10/14/2018 Document Reviewed: 10/14/2018 Elsevier Patient Education  2020 [employer].

## 2019-10-14 NOTE — Progress Notes (Signed)
Please inform patient of the following:  Cholesterol is just mildly elevated, but everything else looks great. Do not need to start medications. Would like for him to keep up the good work and we can recheck in a year or so.

## 2020-05-10 IMAGING — MR MR HEAD WO/W CM
10 series · 48 of 48 positions shown · IV contrast (multihance)
Comparison: None.

CLINICAL DATA: Left-sided numbness

EXAM:
MRI HEAD WITHOUT AND WITH CONTRAST
TECHNIQUE: Multiplanar, multiecho pulse sequences of the brain and surrounding
structures were obtained without and with intravenous contrast.
CONTRAST:  20mL MULTIHANCE GADOBENATE DIMEGLUMINE 529 MG/ML IV SOLN

[Series 3: T1 · sagittal · 5.0mm · 0.45mm/px · 2 of 21 slices shown]
[im 1/21]
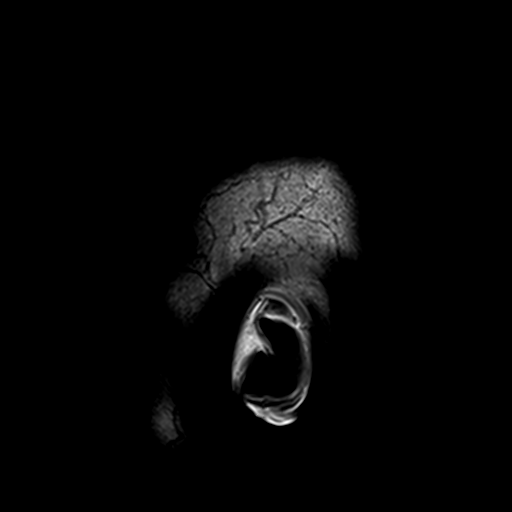
[im 21/21]
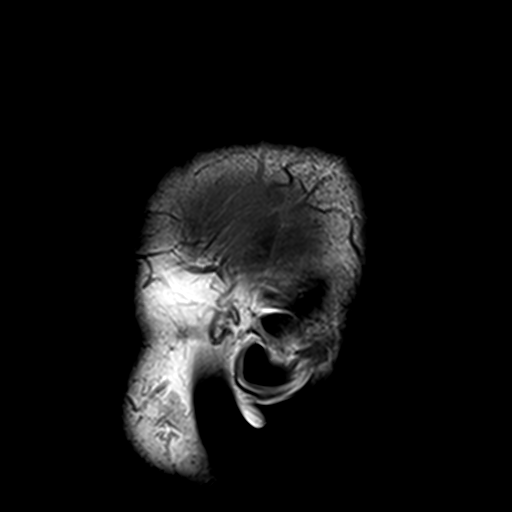

[Series 4: DWI · axial · 3.0mm · 1.80mm/px · z∈[-42,+103]mm · 8 of 100 slices shown (1 of 2)]
[im 1/100]
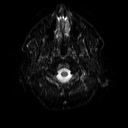
[im 15/100]
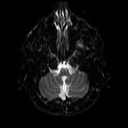
[im 29/100]
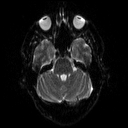
[im 43/100]
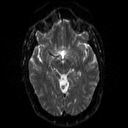
[im 57/100]
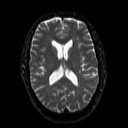
[im 71/100]
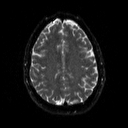
[im 85/100]
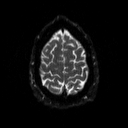
[im 100/100]
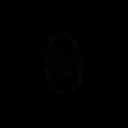

[Series 5: DWI · axial · 3.0mm · 1.80mm/px · z∈[-42,+103]mm · 4 of 47 slices shown (2 of 2)]
[im 1/47]
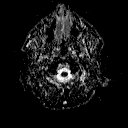
[im 16/47]
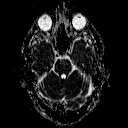
[im 31/47]
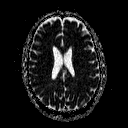
[im 47/47]
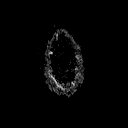

[Series 6: T2 · axial · 5.0mm · 0.60mm/px · z∈[-43,+103]mm · 2 of 22 slices shown (1 of 2)]
[im 1/22]
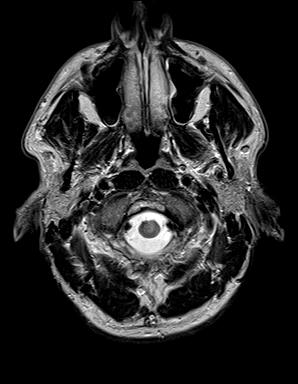
[im 22/22]
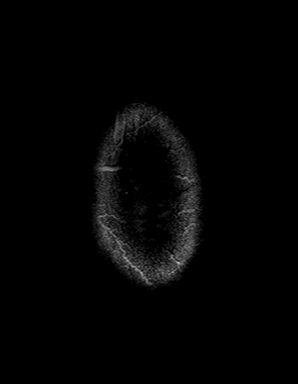

[Series 7: FLAIR · axial · 3.0mm · 0.45mm/px · z∈[-45,+106]mm · 2 of 30 slices shown]
[im 1/30]
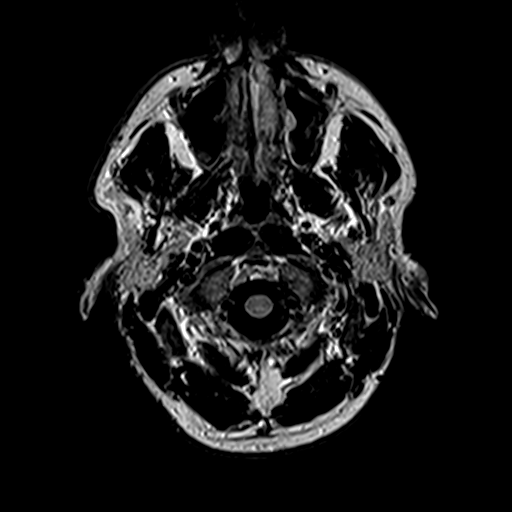
[im 30/30]
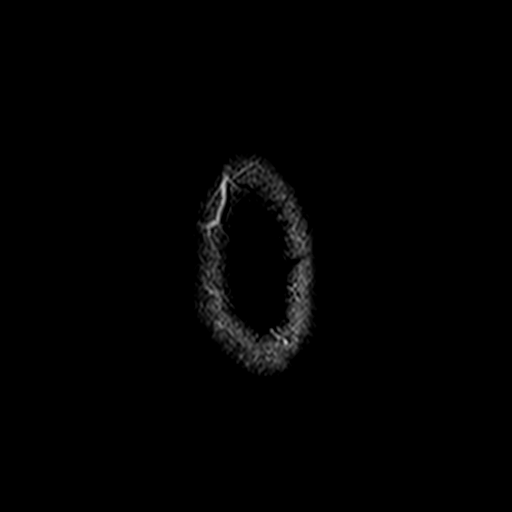

[Series 9: swi_images · axial · 2.0mm · 0.90mm/px · z∈[-48,+109]mm · 6 of 80 slices shown]
[im 1/80]
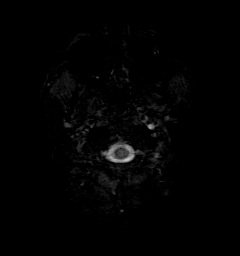
[im 16/80]
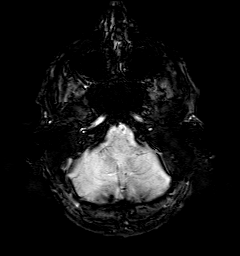
[im 32/80]
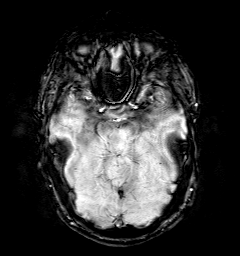
[im 48/80]
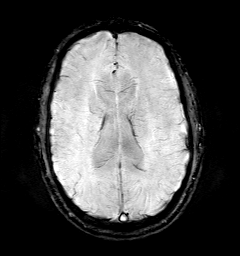
[im 64/80]
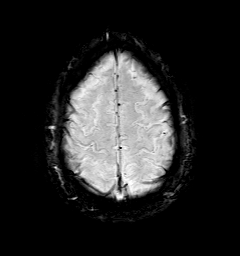
[im 80/80]
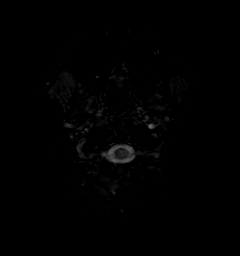

[Series 10: t1_mpr_tra · axial · 1.2mm · 0.72mm/px · z∈[-49,+109]mm · 10 of 128 slices shown (1 of 2)]
[im 1/128]
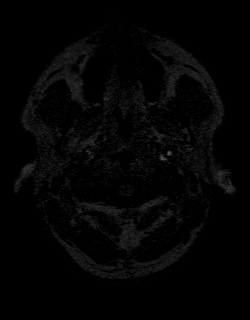
[im 15/128]
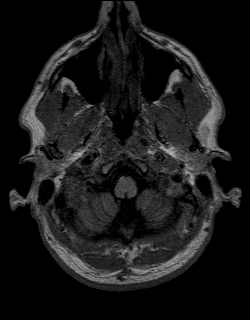
[im 29/128]
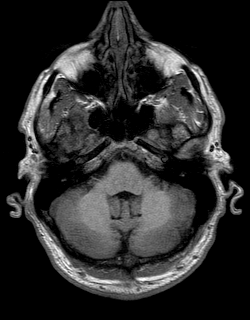
[im 43/128]
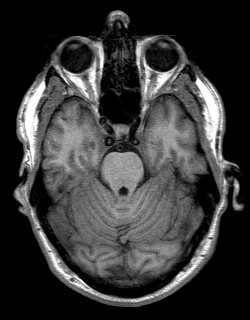
[im 57/128]
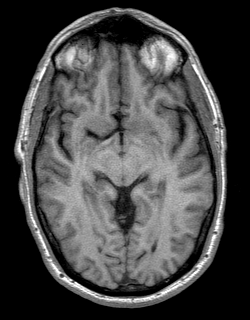
[im 71/128]
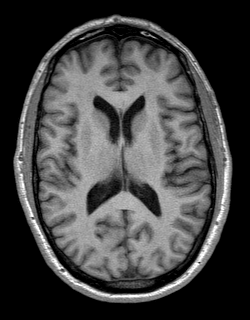
[im 85/128]
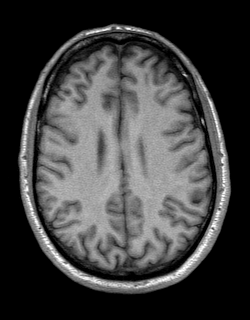
[im 99/128]
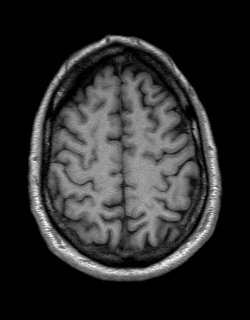
[im 113/128]
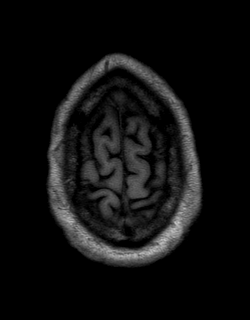
[im 128/128]
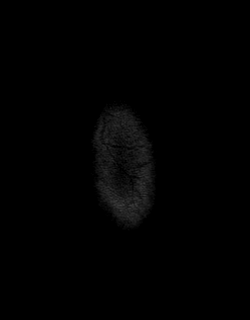

[Series 11: T2 · coronal · 5.0mm · 0.72mm/px · 2 of 27 slices shown (2 of 2)]
[im 1/27]
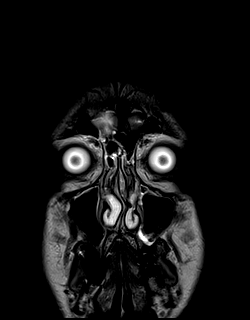
[im 27/27]
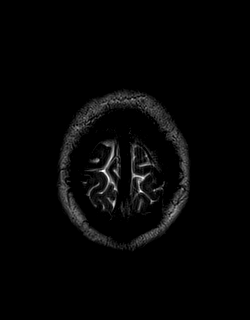

[Series 12: t1_mpr_tra · axial · 1.2mm · 0.72mm/px · z∈[-49,+109]mm · 10 of 128 slices shown (2 of 2)]
[im 1/128]
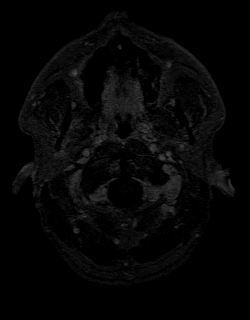
[im 15/128]
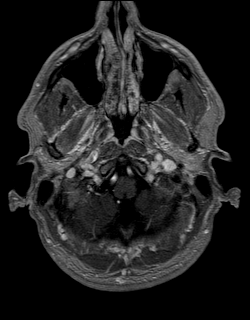
[im 29/128]
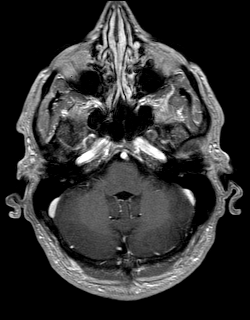
[im 43/128]
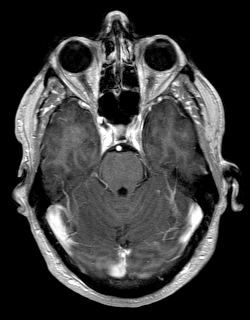
[im 57/128]
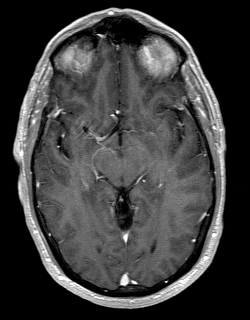
[im 71/128]
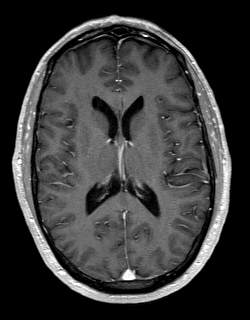
[im 85/128]
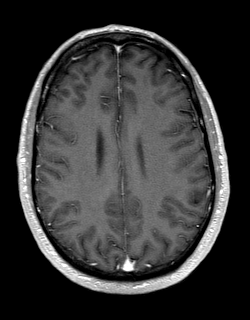
[im 99/128]
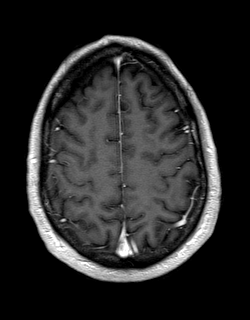
[im 113/128]
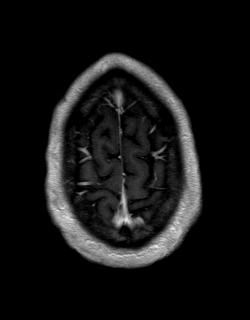
[im 128/128]
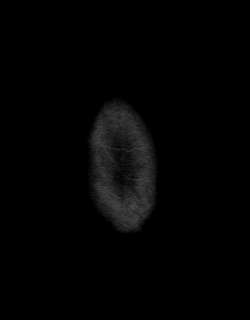

[Series 13: post cor · coronal · 5.0mm · 0.45mm/px · 2 of 27 slices shown]
[im 1/27]
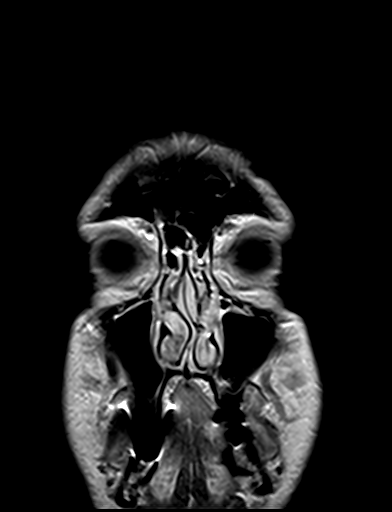
[im 27/27]
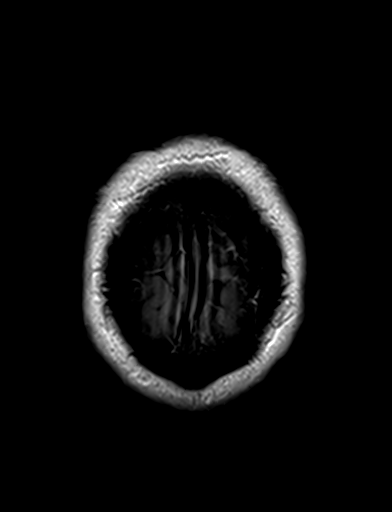

[48 of 48 positions shown; findings below may reference images not displayed]

FINDINGS: Brain: Ventricle size and cerebral volume normal. Negative for acute
infarct. Small hyperintensities in the frontal white matter
bilaterally most consistent with chronic microvascular ischemia.
Negative for hemorrhage or mass. Normal enhancement postcontrast
administration.

Vascular: Normal arterial flow void

Skull and upper cervical spine: Negative

Sinuses/Orbits: Mild mucosal edema paranasal sinuses.  Normal orbit

Other: None
IMPRESSION: No acute abnormality. Mild chronic white matter changes in the
frontal lobes bilaterally most likely chronic microvascular ischemia
or complicated migraine headaches.

## 2020-08-22 ENCOUNTER — Encounter: Payer: Self-pay | Admitting: Family Medicine

## 2020-08-23 ENCOUNTER — Telehealth: Payer: BC Managed Care – PPO | Admitting: Family Medicine

## 2020-08-24 ENCOUNTER — Telehealth: Payer: Self-pay

## 2020-08-24 NOTE — Telephone Encounter (Signed)
Nurse Assessment Nurse: Cain Saupe, RN, Dena Date/Time (Eastern Time): 08/24/2020 8:17:02 AM Confirm and document reason for call. If symptomatic, describe symptoms. ---Caller states he was diagnosed with Covid on Monday; calling today to report shortness of breath, O2 Sat of 89, and chest tightness. Does the patient have any new or worsening symptoms? ---Yes Will a triage be completed? ---Yes Related visit to physician within the last 2 weeks? ---Yes Does the PT have any chronic conditions? (i.e. diabetes, asthma, this includes High risk factors for pregnancy, etc.) ---Yes List chronic conditions. ---HTN Is this a behavioral health or substance abuse call? ---No Guidelines Guideline Title Affirmed Question Affirmed Notes Nurse Date/Time Lamount Cohen Time) Breathing Difficulty Slow, shallow and weak breathing Cain Saupe, RN, Dena 08/24/2020 8:19:01 AM Disp. Time Lamount Cohen Time) Disposition Final User 08/24/2020 8:15:54 AM Send to Urgent Queue Alyse Low 08/24/2020 8:23:19 AM Send To RN Personal Cain Saupe, RN, Dena 08/24/2020 8:30:41 AM 911 Outcome Documentation Cain Saupe, RN, Dena Reason: Pt has called 911; EMS enroute; no further needs at this time. PLEASE NOTE: All timestamps contained within this report are represented as [place] Standard Time. CONFIDENTIALTY NOTICE: This fax transmission is intended only for the addressee. It contains information that is legally privileged, confidential or otherwise protected from use or disclosure. If you are not the intended recipient, you are strictly prohibited from reviewing, disclosing, copying using or disseminating any of this information or taking any action in reliance on or regarding this information. If you have received this fax in error, please notify us immediately by telephone so that we can arrange for its return to [place]. Phone: [phone], Toll-Free: [phone], Fax: [phone] Page: 2 of 2 Call Id: [id number] 08/24/2020 8:22:27 AM Call EMS  911 Now Yes Cain Saupe, RN, Dena Caller Disagree/Comply Comply Caller Understands Yes PreDisposition Did not know what to do Care Advice Given Per Guideline CALL EMS 911 NOW: * Immediate medical attention is needed. You need to hang up and call 911 (or an ambulance). * Triager Discretion: I'll call you back in a few minutes to be sure you were able to reach them. CARE ADVICE given per Breathing Difficulty (Adult) guideline. Comments User: Virginia Crews, RN Date/Time Lamount Cohen Time): 08/24/2020 8:21:22 AM Current O2 sat is 90% Referrals GO TO FACILITY UNDECIDE

## 2020-08-27 ENCOUNTER — Telehealth: Payer: Self-pay

## 2020-08-27 ENCOUNTER — Telehealth (INDEPENDENT_AMBULATORY_CARE_PROVIDER_SITE_OTHER): Payer: BC Managed Care – PPO | Admitting: Family Medicine

## 2020-08-27 VITALS — Temp 99.4°F | Ht 73.0 in | Wt 220.0 lb

## 2020-08-27 DIAGNOSIS — U071 COVID-19: Secondary | ICD-10-CM | POA: Diagnosis not present

## 2020-08-27 MED ORDER — HYDROXYZINE HCL 50 MG PO TABS: 50.0000 mg | ORAL_TABLET | Freq: Three times a day (TID) | ORAL | 0 refills | Status: AC | PRN

## 2020-08-27 MED ORDER — ALBUTEROL SULFATE HFA 108 (90 BASE) MCG/ACT IN AERS: 2.0000 | INHALATION_SPRAY | Freq: Four times a day (QID) | RESPIRATORY_TRACT | 0 refills | Status: AC | PRN

## 2020-08-27 MED ORDER — FLOVENT HFA 110 MCG/ACT IN AERO: 1.0000 | INHALATION_SPRAY | Freq: Every day | RESPIRATORY_TRACT | 0 refills | Status: AC

## 2020-08-27 NOTE — Telephone Encounter (Signed)
Pt was diagnosed with covid and has been out of work since Oct, 18. He states according to CDC that today is when he should return about to work. However, he does not feel well enough at all. He would like a letter for his job covering him through tomorrow.

## 2020-08-27 NOTE — Telephone Encounter (Signed)
FYI, patient has appointment today at 4p

## 2020-08-27 NOTE — Progress Notes (Signed)
   Austin Bonilla is a 54 y.o. male who presents today for a virtual office visit.  Assessment/Plan:  New/Acute Problems: COVID  Patient out of window for antibody infusion.  He has minimally increased work of breathing days on exam, but otherwise appears well and nontoxic.  Do not think he needs emergent evaluation at this point. Symptoms are improving.  Doubt PE given improvement in symptoms over the last few days.  We will start albuterol and flovent see if this helps with his cough/shortness of breath.  He has also had some benefit with over-the-counter antihistamines-we will send in hydroxyzine as well.  He will check in with me in a couple of days via MyChart.  Discussed reasons to return to care/seek emergent care/    Subjective:  HPI:  Patient here with shortness of breath.  He was diagnosed with Covid 7 days ago.  Symptoms started 11 days ago.  Wife is also been diagnosed with Covid.  Symptoms were worse over the past week.  Include cough and shortness of breath.  Symptoms have improved modestly over the last few days though still having persistent shortness of breath.  He tried budesonide from a friend which did not seem to help.  Has been taking antihistamines which do seem to help.  No chest pain.         Objective/Observations  Physical Exam: Gen: NAD, resting comfortably Pulm: Normal work of breathing Neuro: Grossly normal, moves all extremities Psych: Normal affect and thought content  Virtual Visit via Video   I connected with Mary Sella on 08/27/20 at  4:00 PM EDT by a video enabled telemedicine application and verified that I am speaking with the correct person using two identifiers. The limitations of evaluation and management by telemedicine and the availability of in person appointments were discussed. The patient expressed understanding and agreed to proceed.   Patient location: Home Provider location: [place] [facility] [employer] Persons participating in  the virtual visit: Myself and Patient     Katina Degree. Jimmey Ralph, MD 08/27/2020 4:01 PM

## 2020-08-27 NOTE — Telephone Encounter (Signed)
I called patient to check in, he said he called 911 and they just gave him a nebulizer which helped temporarily but did schedule a visit today with Dr.Parker .

## 2020-08-27 NOTE — Telephone Encounter (Signed)
See below

## 2020-08-27 NOTE — Telephone Encounter (Signed)
Please schedule virtual for pt. 

## 2020-08-28 ENCOUNTER — Telehealth: Payer: Self-pay

## 2020-08-28 NOTE — Telephone Encounter (Signed)
See below

## 2020-08-28 NOTE — Telephone Encounter (Signed)
Pt has been using flovent 1x daily and albuterol every 6 hours. Pt states his breathing is not improving.

## 2020-08-28 NOTE — Telephone Encounter (Signed)
We need to scan his lungs and get blood work.  Fastest way to have this done would be to have him go to the emergency room.  If he does not want to go to the ED then we can order labs and a CT scan of his lungs though it may take a few a days to have this all done.  Katina Degree. Jimmey Ralph, MD 08/28/2020 2:04 PM

## 2020-08-28 NOTE — Telephone Encounter (Signed)
Called and lm for pt tcb. 

## 2020-08-30 NOTE — Telephone Encounter (Signed)
Call patient, pt stated inhaler help, Sx improving   he will return to work on Monday.

## 2020-08-31 ENCOUNTER — Telehealth: Payer: Self-pay

## 2020-08-31 NOTE — Telephone Encounter (Signed)
See below

## 2020-08-31 NOTE — Telephone Encounter (Signed)
Patient is calling in asking for an extended work note, states he is feeling a little better but not entirely. Would like for the note to state he has no restrictions and needs to have it for the dates of 10/25 trough 11/8.

## 2020-08-31 NOTE — Telephone Encounter (Signed)
Pt wants to know if his paperwork from [employer] has been received.

## 2020-08-31 NOTE — Telephone Encounter (Signed)
Please advise 

## 2020-09-03 NOTE — Telephone Encounter (Signed)
Please clarify with patient - he wants to go back on 11/8 without restrictions? If so that is ok to write letter.  Katina Degree. Jimmey Ralph, MD 09/03/2020 8:12 AM

## 2020-09-03 NOTE — Telephone Encounter (Signed)
Patient is calling in to verify the letter needs to state he is to return on 09/10/20 with no restrictions.

## 2020-09-03 NOTE — Telephone Encounter (Signed)
LVM to return call for verification

## 2020-09-05 ENCOUNTER — Encounter: Payer: Self-pay | Admitting: Family Medicine

## 2020-10-04 DIAGNOSIS — Z0000 Encounter for general adult medical examination without abnormal findings: Secondary | ICD-10-CM | POA: Diagnosis not present

## 2020-10-04 DIAGNOSIS — R0602 Shortness of breath: Secondary | ICD-10-CM | POA: Diagnosis not present

## 2020-10-04 DIAGNOSIS — J309 Allergic rhinitis, unspecified: Secondary | ICD-10-CM | POA: Diagnosis not present

## 2020-10-04 DIAGNOSIS — I10 Essential (primary) hypertension: Secondary | ICD-10-CM | POA: Diagnosis not present

## 2020-10-04 DIAGNOSIS — E7800 Pure hypercholesterolemia, unspecified: Secondary | ICD-10-CM | POA: Diagnosis not present

## 2020-10-19 DIAGNOSIS — R0602 Shortness of breath: Secondary | ICD-10-CM | POA: Diagnosis not present

## 2020-10-19 DIAGNOSIS — I10 Essential (primary) hypertension: Secondary | ICD-10-CM | POA: Diagnosis not present

## 2020-10-19 DIAGNOSIS — J309 Allergic rhinitis, unspecified: Secondary | ICD-10-CM | POA: Diagnosis not present

## 2020-10-19 DIAGNOSIS — E7800 Pure hypercholesterolemia, unspecified: Secondary | ICD-10-CM | POA: Diagnosis not present

## 2020-11-30 DIAGNOSIS — Z0000 Encounter for general adult medical examination without abnormal findings: Secondary | ICD-10-CM | POA: Diagnosis not present

## 2020-11-30 DIAGNOSIS — E7800 Pure hypercholesterolemia, unspecified: Secondary | ICD-10-CM | POA: Diagnosis not present

## 2020-11-30 DIAGNOSIS — I10 Essential (primary) hypertension: Secondary | ICD-10-CM | POA: Diagnosis not present

## 2021-01-10 DIAGNOSIS — K219 Gastro-esophageal reflux disease without esophagitis: Secondary | ICD-10-CM | POA: Diagnosis not present

## 2021-01-10 DIAGNOSIS — I10 Essential (primary) hypertension: Secondary | ICD-10-CM | POA: Diagnosis not present

## 2021-01-10 DIAGNOSIS — E7800 Pure hypercholesterolemia, unspecified: Secondary | ICD-10-CM | POA: Diagnosis not present

## 2021-03-18 DIAGNOSIS — E7800 Pure hypercholesterolemia, unspecified: Secondary | ICD-10-CM | POA: Diagnosis not present

## 2021-03-18 DIAGNOSIS — H5712 Ocular pain, left eye: Secondary | ICD-10-CM | POA: Diagnosis not present

## 2021-03-18 DIAGNOSIS — I10 Essential (primary) hypertension: Secondary | ICD-10-CM | POA: Diagnosis not present

## 2021-04-11 DIAGNOSIS — I10 Essential (primary) hypertension: Secondary | ICD-10-CM | POA: Diagnosis not present

## 2021-04-11 DIAGNOSIS — J309 Allergic rhinitis, unspecified: Secondary | ICD-10-CM | POA: Diagnosis not present

## 2021-04-11 DIAGNOSIS — E7800 Pure hypercholesterolemia, unspecified: Secondary | ICD-10-CM | POA: Diagnosis not present

## 2021-04-11 DIAGNOSIS — K219 Gastro-esophageal reflux disease without esophagitis: Secondary | ICD-10-CM | POA: Diagnosis not present

## 2021-07-09 DIAGNOSIS — K219 Gastro-esophageal reflux disease without esophagitis: Secondary | ICD-10-CM | POA: Diagnosis not present

## 2021-07-09 DIAGNOSIS — E7800 Pure hypercholesterolemia, unspecified: Secondary | ICD-10-CM | POA: Diagnosis not present

## 2021-07-09 DIAGNOSIS — I10 Essential (primary) hypertension: Secondary | ICD-10-CM | POA: Diagnosis not present

## 2021-07-09 DIAGNOSIS — J309 Allergic rhinitis, unspecified: Secondary | ICD-10-CM | POA: Diagnosis not present

## 2021-08-19 DIAGNOSIS — Z7189 Other specified counseling: Secondary | ICD-10-CM | POA: Diagnosis not present

## 2021-08-19 DIAGNOSIS — E7800 Pure hypercholesterolemia, unspecified: Secondary | ICD-10-CM | POA: Diagnosis not present

## 2021-08-19 DIAGNOSIS — K219 Gastro-esophageal reflux disease without esophagitis: Secondary | ICD-10-CM | POA: Diagnosis not present

## 2021-08-19 DIAGNOSIS — I10 Essential (primary) hypertension: Secondary | ICD-10-CM | POA: Diagnosis not present

## 2021-08-19 DIAGNOSIS — Z6830 Body mass index (BMI) 30.0-30.9, adult: Secondary | ICD-10-CM | POA: Diagnosis not present

## 2022-07-28 ENCOUNTER — Encounter: Payer: Self-pay | Admitting: *Deleted

## 2022-10-16 ENCOUNTER — Encounter: Payer: Self-pay | Admitting: *Deleted
